# Patient Record
Sex: Female | Born: 1990 | Race: White | Hispanic: No | Marital: Married | Smoking: Former smoker
Health system: Southern US, Academic
[De-identification: ages and names within clinical notes are randomized; demographics above are authoritative.]

## PROBLEM LIST (undated history)

## (undated) DIAGNOSIS — Z789 Other specified health status: Secondary | ICD-10-CM

## (undated) DIAGNOSIS — N809 Endometriosis, unspecified: Secondary | ICD-10-CM

## (undated) HISTORY — PX: HX PELVIC LAPAROSCOPY: SHX162

## (undated) HISTORY — DX: Endometriosis, unspecified: N80.9

---
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
## 2015-08-10 DIAGNOSIS — S022XXA Fracture of nasal bones, initial encounter for closed fracture: Secondary | ICD-10-CM

## 2015-08-10 DIAGNOSIS — S31114A Laceration without foreign body of abdominal wall, left lower quadrant without penetration into peritoneal cavity, initial encounter: Secondary | ICD-10-CM

## 2015-08-10 DIAGNOSIS — S0181XA Laceration without foreign body of other part of head, initial encounter: Secondary | ICD-10-CM

## 2015-08-11 DIAGNOSIS — F0781 Postconcussional syndrome: Secondary | ICD-10-CM

## 2019-06-11 ENCOUNTER — Emergency Department (HOSPITAL_COMMUNITY): Payer: Medicaid Other

## 2019-06-11 ENCOUNTER — Encounter (HOSPITAL_COMMUNITY): Payer: Self-pay | Admitting: Emergency Medicine

## 2019-06-11 ENCOUNTER — Emergency Department (HOSPITAL_COMMUNITY): Payer: Medicaid Other | Admitting: Certified Registered"

## 2019-06-11 ENCOUNTER — Encounter (HOSPITAL_COMMUNITY)
Admission: EM | Disposition: A | Discharge status: Home / Self Care | Payer: Self-pay | Source: Home / Self Care | Attending: Emergency Medicine

## 2019-06-11 ENCOUNTER — Other Ambulatory Visit: Payer: Self-pay

## 2019-06-11 ENCOUNTER — Ambulatory Visit (HOSPITAL_COMMUNITY)
Admission: EM | Admit: 2019-06-11 | Discharge: 2019-06-11 | Disposition: A | Discharge status: Home / Self Care | Payer: Medicaid Other | Attending: Emergency Medicine | Admitting: Emergency Medicine

## 2019-06-11 DIAGNOSIS — Z3A23 23 weeks gestation of pregnancy: Secondary | ICD-10-CM | POA: Insufficient documentation

## 2019-06-11 DIAGNOSIS — Z20822 Contact with and (suspected) exposure to covid-19: Secondary | ICD-10-CM | POA: Insufficient documentation

## 2019-06-11 DIAGNOSIS — Z881 Allergy status to other antibiotic agents status: Secondary | ICD-10-CM | POA: Insufficient documentation

## 2019-06-11 DIAGNOSIS — S42292A Other displaced fracture of upper end of left humerus, initial encounter for closed fracture: Secondary | ICD-10-CM | POA: Diagnosis not present

## 2019-06-11 DIAGNOSIS — Y92410 Unspecified street and highway as the place of occurrence of the external cause: Secondary | ICD-10-CM | POA: Diagnosis not present

## 2019-06-11 DIAGNOSIS — O9A212 Injury, poisoning and certain other consequences of external causes complicating pregnancy, second trimester: Secondary | ICD-10-CM | POA: Insufficient documentation

## 2019-06-11 DIAGNOSIS — S4292XA Fracture of left shoulder girdle, part unspecified, initial encounter for closed fracture: Secondary | ICD-10-CM

## 2019-06-11 DIAGNOSIS — T148XXA Other injury of unspecified body region, initial encounter: Secondary | ICD-10-CM

## 2019-06-11 DIAGNOSIS — S43006A Unspecified dislocation of unspecified shoulder joint, initial encounter: Secondary | ICD-10-CM

## 2019-06-11 DIAGNOSIS — S43005A Unspecified dislocation of left shoulder joint, initial encounter: Secondary | ICD-10-CM

## 2019-06-11 HISTORY — PX: SHOULDER CLOSED REDUCTION: SHX1051

## 2019-06-11 LAB — COMPREHENSIVE METABOLIC PANEL
ALT: 22 U/L (ref 0–44)
AST: 28 U/L (ref 15–41)
Albumin: 3 g/dL — ABNORMAL LOW (ref 3.5–5.0)
Alkaline Phosphatase: 64 U/L (ref 38–126)
Anion gap: 9 (ref 5–15)
BUN: 7 mg/dL (ref 6–20)
CO2: 20 mmol/L — ABNORMAL LOW (ref 22–32)
Calcium: 8.7 mg/dL — ABNORMAL LOW (ref 8.9–10.3)
Chloride: 108 mmol/L (ref 98–111)
Creatinine, Ser: 0.48 mg/dL (ref 0.44–1.00)
GFR calc Af Amer: >60 mL/min (ref >60)
GFR calc non Af Amer: >60 mL/min (ref >60)
Glucose, Bld: 120 mg/dL — ABNORMAL HIGH (ref 70–99)
Potassium: 4.1 mmol/L (ref 3.5–5.1)
Sodium: 137 mmol/L (ref 135–145)
Total Bilirubin: 0.3 mg/dL (ref 0.3–1.2)
Total Protein: 6.3 g/dL — ABNORMAL LOW (ref 6.5–8.1)

## 2019-06-11 LAB — I-STAT CHEM 8, ED
BUN: 6 mg/dL (ref 6–20)
Calcium, Ion: 1.11 mmol/L — ABNORMAL LOW (ref 1.15–1.40)
Chloride: 109 mmol/L (ref 98–111)
Creatinine, Ser: 0.3 mg/dL — ABNORMAL LOW (ref 0.44–1.00)
Glucose, Bld: 115 mg/dL — ABNORMAL HIGH (ref 70–99)
HCT: 33 % — ABNORMAL LOW (ref 36.0–46.0)
Hemoglobin: 11.2 g/dL — ABNORMAL LOW (ref 12.0–15.0)
Potassium: 3.8 mmol/L (ref 3.5–5.1)
Sodium: 137 mmol/L (ref 135–145)
TCO2: 22 mmol/L (ref 22–32)

## 2019-06-11 LAB — RESPIRATORY PANEL BY RT PCR (FLU A&B, COVID)
Influenza A by PCR: NEGATIVE
Influenza B by PCR: NEGATIVE
SARS Coronavirus 2 by RT PCR: NEGATIVE

## 2019-06-11 LAB — TYPE AND SCREEN
ABO/RH(D): A POS
Antibody Screen: NEGATIVE

## 2019-06-11 LAB — CBC
HCT: 35.6 % — ABNORMAL LOW (ref 36.0–46.0)
Hemoglobin: 12 g/dL (ref 12.0–15.0)
MCH: 32.3 pg (ref 26.0–34.0)
MCHC: 33.7 g/dL (ref 30.0–36.0)
MCV: 96 fL (ref 80.0–100.0)
Platelets: 307 10*3/uL (ref 150–400)
RBC: 3.71 MIL/uL — ABNORMAL LOW (ref 3.87–5.11)
RDW: 13.6 % (ref 11.5–15.5)
WBC: 21.9 10*3/uL — ABNORMAL HIGH (ref 4.0–10.5)
nRBC: 0 % (ref 0.0–0.2)

## 2019-06-11 LAB — ABO/RH: ABO/RH(D): A POS

## 2019-06-11 SURGERY — CLOSED REDUCTION, SHOULDER
Anesthesia: General | Site: Shoulder | Laterality: Left

## 2019-06-11 MED ORDER — PROPOFOL 500 MG/50ML IV EMUL
INTRAVENOUS | Status: DC | PRN
  Administered 2019-06-11 (×2): 200 mg via INTRAVENOUS

## 2019-06-11 MED ORDER — OXYCODONE HCL 5 MG PO TABS: 5.0000 mg | ORAL_TABLET | Freq: Once | ORAL | Status: DC | PRN

## 2019-06-11 MED ORDER — PROPOFOL 10 MG/ML IV BOLUS
INTRAVENOUS | Status: AC
  Filled 2019-06-11: qty 20

## 2019-06-11 MED ORDER — OXYCODONE HCL 5 MG/5ML PO SOLN: 5.0000 mg | Freq: Once | ORAL | Status: DC | PRN

## 2019-06-11 MED ORDER — HYDROCODONE-ACETAMINOPHEN 5-325 MG PO TABS: 1.0000 | ORAL_TABLET | Freq: Four times a day (QID) | ORAL | 0 refills | Status: AC | PRN

## 2019-06-11 MED ORDER — IOHEXOL 300 MG/ML  SOLN
75.0000 mL | Freq: Once | INTRAMUSCULAR | Status: AC | PRN
  Administered 2019-06-11: 75 mL via INTRAVENOUS

## 2019-06-11 MED ORDER — FENTANYL CITRATE (PF) 100 MCG/2ML IJ SOLN
50.0000 ug | Freq: Once | INTRAMUSCULAR | Status: AC
  Administered 2019-06-11: 50 ug via INTRAVENOUS
  Filled 2019-06-11: qty 2

## 2019-06-11 MED ORDER — LIDOCAINE 2% (20 MG/ML) 5 ML SYRINGE
INTRAMUSCULAR | Status: AC
  Filled 2019-06-11: qty 5

## 2019-06-11 MED ORDER — FENTANYL CITRATE (PF) 100 MCG/2ML IJ SOLN: 25.0000 ug | INTRAMUSCULAR | Status: DC | PRN

## 2019-06-11 MED ORDER — ONDANSETRON HCL 4 MG/2ML IJ SOLN: 4.0000 mg | Freq: Four times a day (QID) | INTRAMUSCULAR | Status: DC | PRN

## 2019-06-11 MED ORDER — FENTANYL CITRATE (PF) 100 MCG/2ML IJ SOLN: 25.0000 ug | Freq: Once | INTRAMUSCULAR | Status: DC

## 2019-06-11 MED ORDER — FENTANYL CITRATE (PF) 250 MCG/5ML IJ SOLN
INTRAMUSCULAR | Status: AC
  Filled 2019-06-11: qty 5

## 2019-06-11 MED ORDER — ROCURONIUM BROMIDE 10 MG/ML (PF) SYRINGE
PREFILLED_SYRINGE | INTRAVENOUS | Status: AC
  Filled 2019-06-11: qty 10

## 2019-06-11 NOTE — Transfer of Care (Signed)
Immediate Anesthesia Transfer of Care Note  Patient: Pam Cobb  Procedure(s) Performed: CLOSED REDUCTION SHOULDER (Left Shoulder)  Patient Location: PACU  Anesthesia Type:General  Level of Consciousness: awake, alert , oriented and patient cooperative  Airway & Oxygen Therapy: Patient Spontanous Breathing  Post-op Assessment: Report given to RN, Post -op Vital signs reviewed and stable and Patient moving all extremities X 4  Post vital signs: Reviewed and stable  Last Vitals:  Vitals Value Taken Time  BP 112/73 06/11/19 1906  Temp 36.6 C 06/11/19 1906  Pulse 98 06/11/19 1911  Resp 19 06/11/19 1911  SpO2 99 % 06/11/19 1911  Vitals shown include unvalidated device data.  Last Pain:  Vitals:   06/11/19 1906  TempSrc:   PainSc: 0-No pain         Complications: No apparent anesthesia complications

## 2019-06-11 NOTE — ED Notes (Signed)
RN educated patient about the importance of wearing a cervical collar due to pt endorsing L neck pain. Pt attempted to wear collar but told staff to take it off x3. RN gave pain medication per Dr. Denny Levy order, and re-attempted to place C-collar and pt refused.

## 2019-06-11 NOTE — Anesthesia Preprocedure Evaluation (Signed)
Anesthesia Evaluation  Patient identified by MRN, date of birth, ID band Patient awake    Reviewed: Allergy & Precautions, H&P , NPO status , Patient's Chart, lab work & pertinent test results  Airway Mallampati: II   Neck ROM: full    Dental   Pulmonary neg pulmonary ROS,    breath sounds clear to auscultation       Cardiovascular negative cardio ROS   Rhythm:regular Rate:Normal     Neuro/Psych    GI/Hepatic   Endo/Other    Renal/GU      Musculoskeletal Left shoulder dislocation.   Abdominal   Peds  Hematology   Anesthesia Other Findings   Reproductive/Obstetrics (+) Pregnancy                             Anesthesia Physical Anesthesia Plan  ASA: II  Anesthesia Plan: General   Post-op Pain Management:    Induction: Intravenous  PONV Risk Score and Plan: 3 and Propofol infusion, Ondansetron and Treatment may vary due to age or medical condition  Airway Management Planned: Mask  Additional Equipment:   Intra-op Plan:   Post-operative Plan:   Informed Consent: I have reviewed the patients History and Physical, chart, labs and discussed the procedure including the risks, benefits and alternatives for the proposed anesthesia with the patient or authorized representative who has indicated his/her understanding and acceptance.       Plan Discussed with: CRNA, Anesthesiologist and Surgeon  Anesthesia Plan Comments:         Anesthesia Quick Evaluation
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
## 2019-06-11 NOTE — ED Notes (Addendum)
Pt informed that a cervical collar needed to be applied. Staff attempted to place the collar. Pt stated that she was experiencing too much pain to wear it. Pt informed that it was important for her to wear it and that it was for her protection. Pt asked staff to remove the collar X 3.
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
## 2019-06-11 NOTE — ED Notes (Signed)
Pt given cervical collar but refused to have it applied due to pain at the left shoulder. Mykenzie Cochrane, RN gave pt pain medicine to help. Cervical collar was applied, however pt requested to take it off immediately because pain still could not be tolerated. [facility] is at bedside.

## 2019-06-11 NOTE — ED Notes (Signed)
Dr haddox at  The bedside  Or ready

## 2019-06-11 NOTE — ED Notes (Signed)
Unsuccessful attempt to relocate shoulder
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
## 2019-06-11 NOTE — ED Notes (Signed)
To ct

## 2019-06-11 NOTE — ED Notes (Signed)
Rapid OB at bedside 
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
## 2019-06-11 NOTE — Discharge Instructions (Signed)
[facility] Discharge Instructions   General Discharge Instructions  WEIGHT BEARING STATUS: Non-weightbearing left upper extremity  RANGE OF MOTION/ACTIVITY: Remain in sling at all times  DVT/PE prophylaxis: None  Diet: as you were eating previously.  Can use over the counter stool softeners and bowel preparations, such as Miralax, to help with bowel movements.  Narcotics can be constipating.  Be sure to drink plenty of fluids  PAIN MEDICATION USE AND EXPECTATIONS  You have likely been given narcotic medications to help control your pain.  After a traumatic event that results in an fracture (broken bone) with or without surgery, it is ok to use narcotic pain medications to help control one's pain.  We understand that everyone responds to pain differently and each individual patient will be evaluated on a regular basis for the continued need for narcotic medications. Ideally, narcotic medication use should last no more than 6-8 weeks (coinciding with fracture healing).   As a patient it is your responsibility as well to monitor narcotic medication use and report the amount and frequency you use these medications when you come to your office visit.   We would also advise that if you are using narcotic medications, you should take a dose prior to therapy to maximize you participation.  IF YOU ARE ON NARCOTIC MEDICATIONS IT IS NOT PERMISSIBLE TO OPERATE A MOTOR VEHICLE (MOTORCYCLE/CAR/TRUCK/MOPED) OR HEAVY MACHINERY DO NOT MIX NARCOTICS WITH OTHER CNS (CENTRAL NERVOUS SYSTEM) DEPRESSANTS SUCH AS ALCOHOL   STOP SMOKING OR USING NICOTINE PRODUCTS!!!!  As discussed nicotine severely impairs your body's ability to heal surgical and traumatic wounds but also impairs bone healing.  Wounds and bone heal by forming microscopic blood vessels (angiogenesis) and nicotine is a vasoconstrictor (essentially, shrinks blood vessels).  Therefore, if vasoconstriction occurs to these microscopic  blood vessels they essentially disappear and are unable to deliver necessary nutrients to the healing tissue.  This is one modifiable factor that you can do to dramatically increase your chances of healing your injury.    (This means no smoking, no nicotine gum, patches, etc)  DO NOT USE NONSTEROIDAL ANTI-INFLAMMATORY DRUGS (NSAID'S)  Using products such as Advil (ibuprofen), Aleve (naproxen), Motrin (ibuprofen) for additional pain control during fracture healing can delay and/or prevent the healing response.  If you would like to take over the counter (OTC) medication, Tylenol (acetaminophen) is ok.  However, some narcotic medications that are given for pain control contain acetaminophen as well. Therefore, you should not exceed more than 4000 mg of tylenol in a day if you do not have liver disease.  Also note that there are may OTC medicines, such as cold medicines and allergy medicines that my contain tylenol as well.  If you have any questions about medications and/or interactions please ask your doctor/PA or your pharmacist.      ICE AND ELEVATE INJURED/OPERATIVE EXTREMITY  Using ice and elevating the injured extremity above your heart can help with swelling and pain control.  Icing in a pulsatile fashion, such as 20 minutes on and 20 minutes off, can be followed.    Do not place ice directly on skin. Make sure there is a barrier between to skin and the ice pack.    Using frozen items such as frozen peas works well as the conform nicely to the are that needs to be iced.  USE AN ACE WRAP OR TED HOSE FOR SWELLING CONTROL  In addition to icing and elevation, Ace wraps or TED hose are used  to help limit and resolve swelling.  It is recommended to use Ace wraps or TED hose until you are informed to stop.    When using Ace Wraps start the wrapping distally (farthest away from the body) and wrap proximally (closer to the body)   Example: If you had surgery on your leg or thing and you do not have a  splint on, start the ace wrap at the toes and work your way up to the thigh        If you had surgery on your upper extremity and do not have a splint on, start the ace wrap at your fingers and work your way up to the upper arm   CALL THE OFFICE WITH ANY QUESTIONS OR CONCERNS: [phone]   VISIT OUR WEBSITE FOR ADDITIONAL INFORMATION: [email or web address]

## 2019-06-11 NOTE — Progress Notes (Signed)
OB RR RN at bedside for post procedural assessment of G3P2 at [redacted]w[redacted]d. FHR 136 by doppler, pt reports good fetal movement, no bleeding, cramping, or leaking of fluid.  Instructed to go to MAU if any problems develop after she leaves PACU.

## 2019-06-11 NOTE — ED Notes (Signed)
To or 

## 2019-06-11 NOTE — Progress Notes (Signed)
Orthopedic Tech Progress Note Patient Details:  Pam Cobb 08-24-1990 [id number]  Ortho Devices Type of Ortho Device: Shoulder immobilizer Ortho Device/Splint Location: left Ortho Device/Splint Interventions: Application   Post Interventions Patient Tolerated: Well Instructions Provided: Care of device   [name] 06/11/2019, 3:30 PM
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
## 2019-06-11 NOTE — Progress Notes (Signed)
Discharge AVS reviewed with patients husband Gerilyn Pilgrim via ED. All questions answered and verbalized understanding.  1932-OB RN at bedside to assess fetal heart tones.

## 2019-06-11 NOTE — ED Notes (Signed)
Rapid OB called per Dr. Rosalia Hammers for fetal monitoring.

## 2019-06-11 NOTE — ED Notes (Signed)
Pt returned from c-t  covid swab walked to lab

## 2019-06-11 NOTE — ED Notes (Signed)
Crying constantly

## 2019-06-13 NOTE — Anesthesia Postprocedure Evaluation (Signed)
Anesthesia Post Note  Patient: Pam Cobb  Procedure(s) Performed: CLOSED REDUCTION SHOULDER (Left Shoulder)     Patient location during evaluation: PACU Anesthesia Type: General Level of consciousness: awake and alert Pain management: pain level controlled Vital Signs Assessment: post-procedure vital signs reviewed and stable Respiratory status: spontaneous breathing, nonlabored ventilation, respiratory function stable and patient connected to nasal cannula oxygen Cardiovascular status: blood pressure returned to baseline and stable Postop Assessment: no apparent nausea or vomiting Anesthetic complications: no    Last Vitals:  Vitals:   06/11/19 1918 06/11/19 1933  BP: 120/78 113/63  Pulse: 91 80  Resp: 19 18  Temp: 36.6 C 36.6 C  SpO2: 100% 100%    Last Pain:  Vitals:   06/11/19 1933  TempSrc:   PainSc: 0-No pain                 Deniah Saia S

## 2020-04-19 IMAGING — CT CT CHEST W/ CM
3 series · 14 of 35 positions shown, 17 images · IV contrast (Omni 300)
Comparison: None.

CLINICAL DATA: Neck trauma. Pain in left shoulder and neck that
radiates down into hand.

EXAM:
CT CERVICAL SPINE WITHOUT CONTRAST
CT CHEST WITH CONTRAST
TECHNIQUE: Multidetector CT imaging of the cervical spine was performed without
intravenous contrast. Multiplanar CT image reconstructions were also
generated.
Multidetector CT imaging of the chest was performed following the
standard protocol during bolus administration of intravenous
contrast.
CONTRAST:  75mL OMNIPAQUE IOHEXOL 300 MG/ML  SOLN

[Series 1: chest with 2mm st · axial · 0.89mm/px · z∈[-413,-173]mm · 6 of 157 slices shown, 8 images]
[im 25/157  soft-tissue]
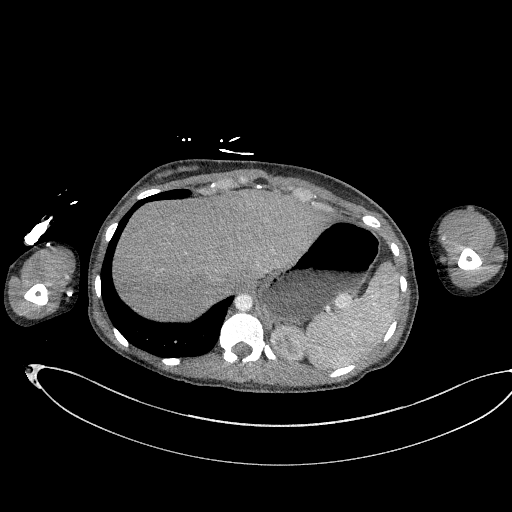
[im 25/157  bone]
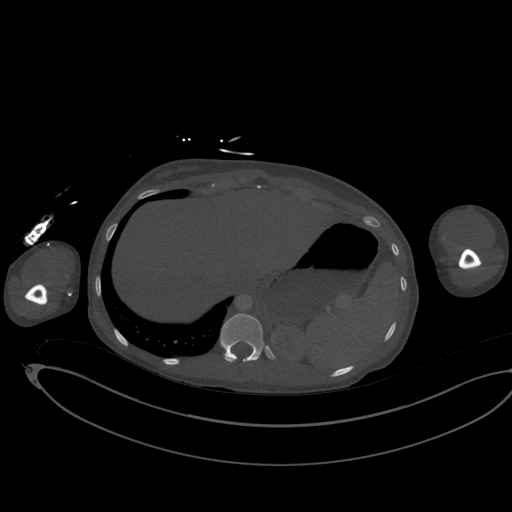
[im 49/157  bone]
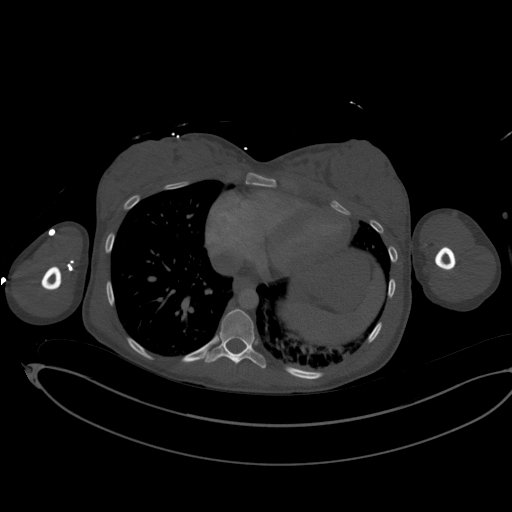
[im 73/157  bone]
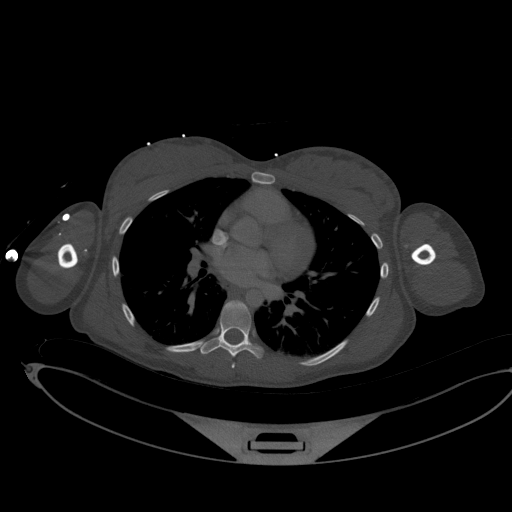
[im 97/157  bone]
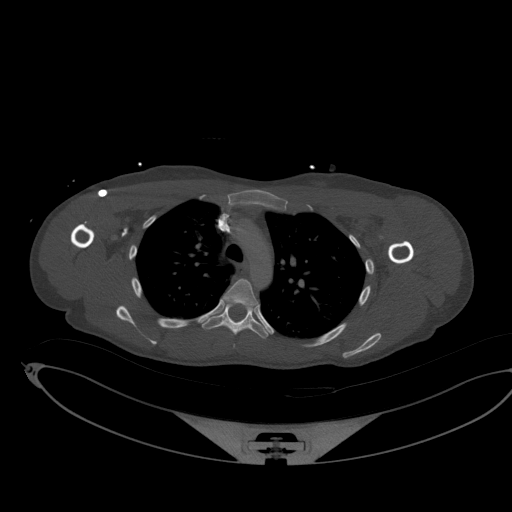
[im 121/157  soft-tissue]
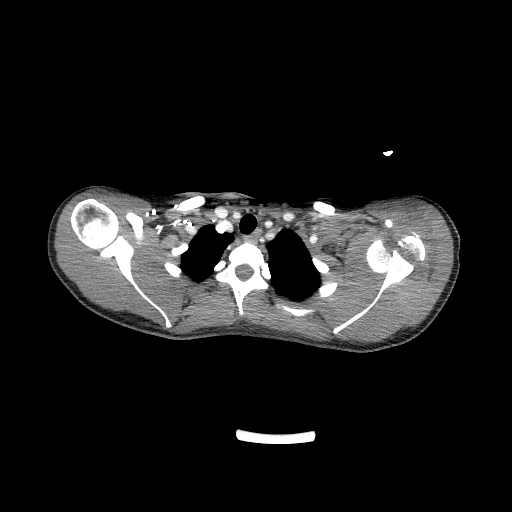
[im 121/157  bone]
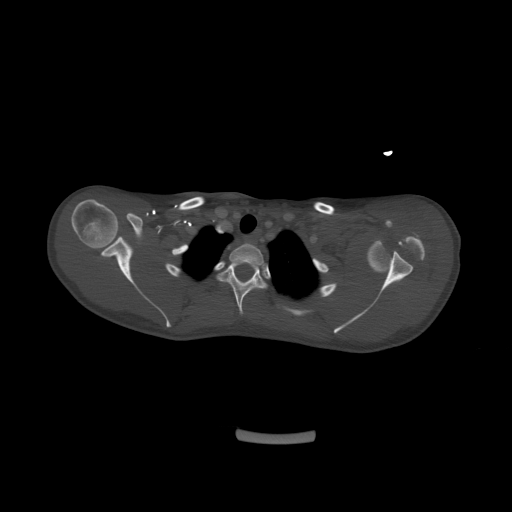
[im 145/157  bone]
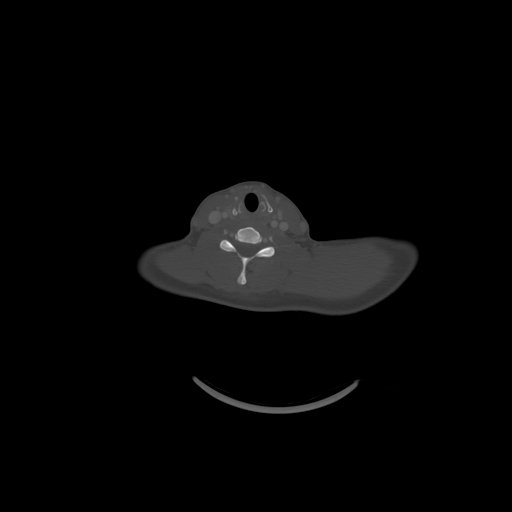

[Series 5: chest with 2mm st cor · coronal · 0.62mm/px · 3 of 147 slices shown]
[im 30/147  bone]
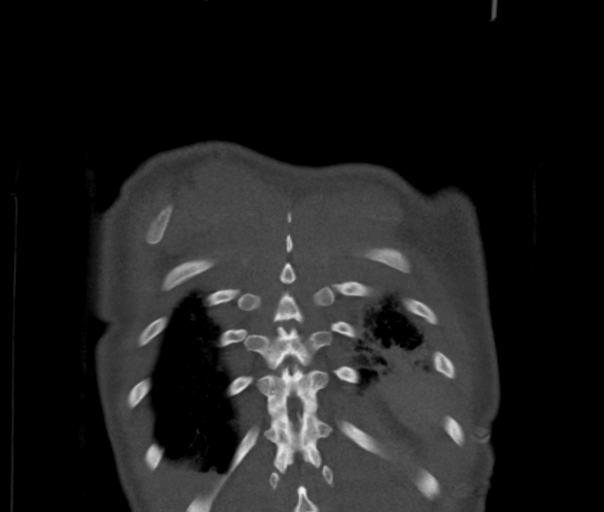
[im 59/147  bone]
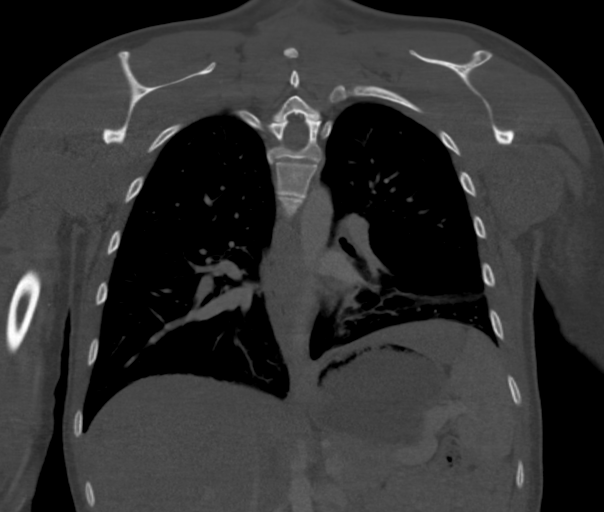
[im 88/147  bone]
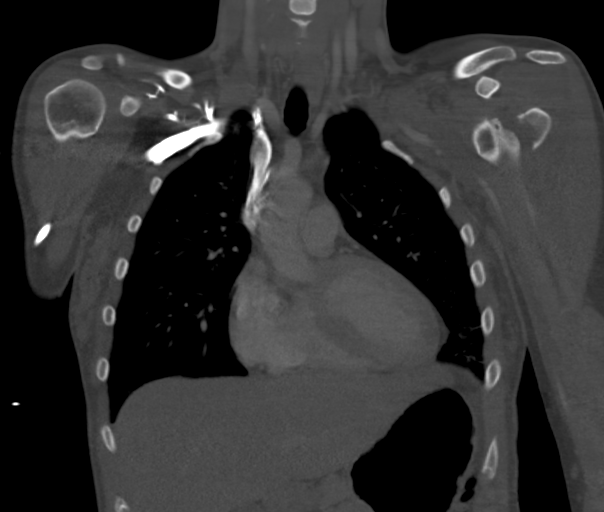

[Series 6: chest with 2mm st sag · sagittal · 0.53mm/px · 5 of 172 slices shown, 6 images]
[im 58/172  bone]
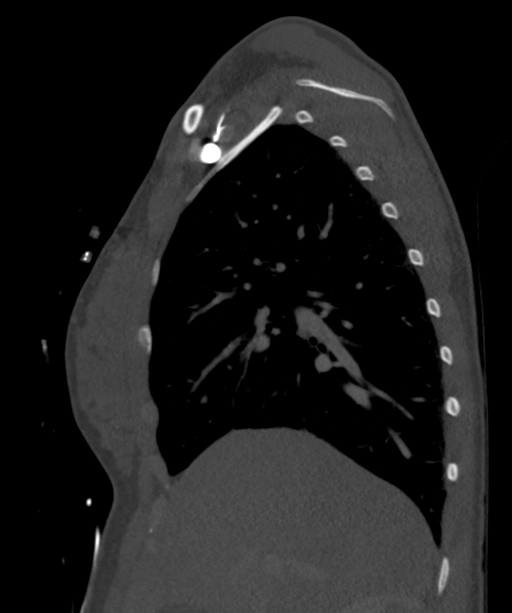
[im 72/172  bone]
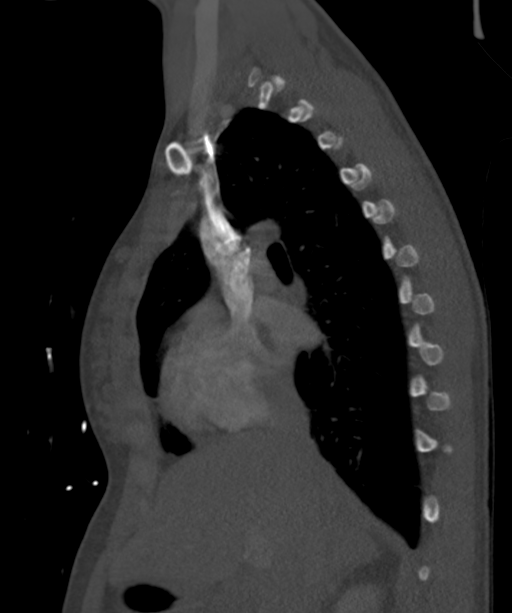
[im 86/172  soft-tissue]
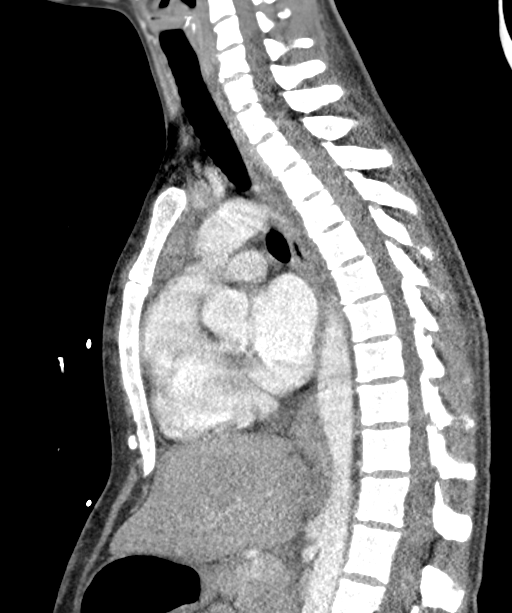
[im 86/172  bone]
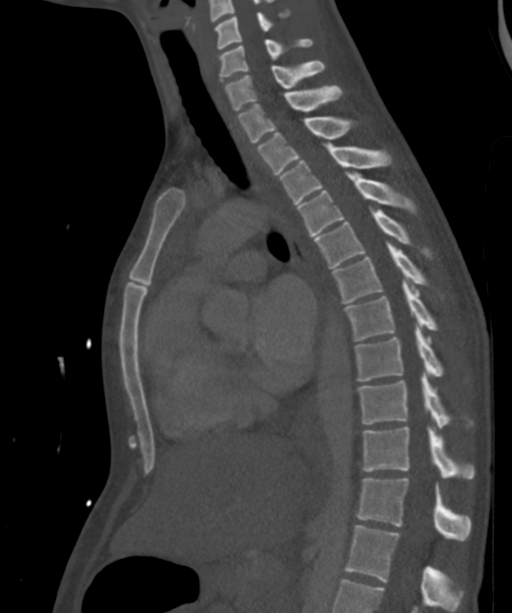
[im 100/172  bone]
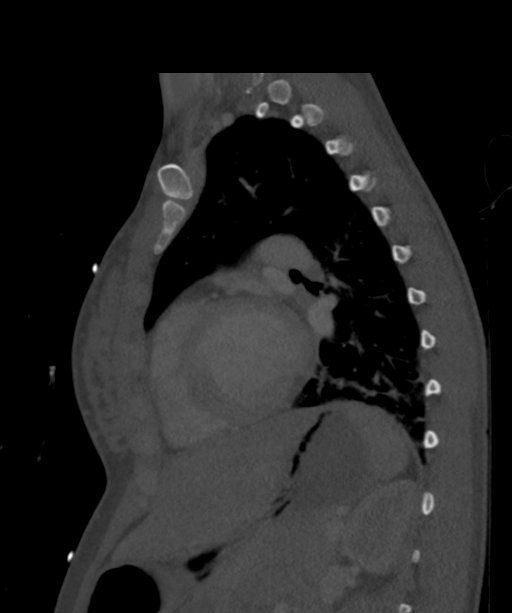
[im 115/172  bone]
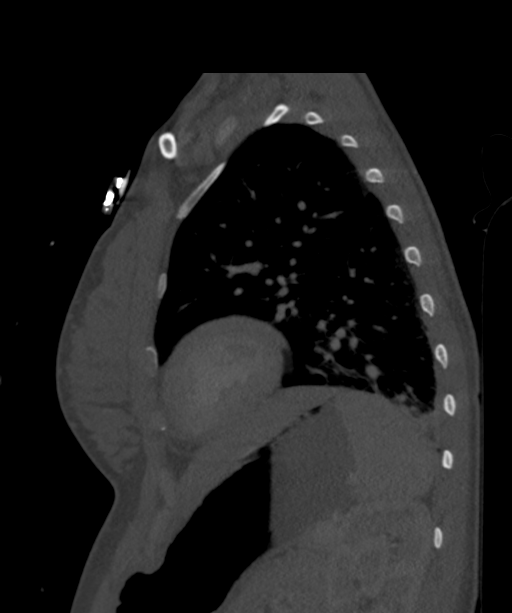

[14 of 35 positions shown; findings below may reference images not displayed]

FINDINGS: CT CERVICAL FINDINGS

Alignment: Normal.

Skull base and vertebrae: No acute fracture. No primary bone lesion
or focal pathologic process.

Soft tissues and spinal canal: No prevertebral fluid or swelling. No
visible canal hematoma.

Disc levels:  Normal.

Other: There is extensive fat stranding in the left supraclavicular
region at about the left shoulder.

CT CHEST FINDINGS

Cardiovascular: The heart size is normal. There is no significant
pericardial effusion. No evidence for thoracic aortic aneurysm or
dissection. There is no large centrally located pulmonary embolism.

Mediastinum/Nodes:

--No mediastinal or hilar lymphadenopathy.

--No axillary lymphadenopathy.

--No supraclavicular lymphadenopathy.

--Normal thyroid gland.

--The esophagus is unremarkable

Lungs/Pleura: There is no pneumothorax. There is some atelectasis at
the left lung base. There is no significant pleural effusion. There
is some debris within the trachea.

Upper Abdomen: The stomach is distended.

Musculoskeletal: Again noted is an acute fracture dislocation of the
left shoulder. There is a Neer 2 part fracture involving the greater
tuberosity. The humeral head is dislocated anteriorly and inferiorly
and is perched on the inferior glenoid. There is no evidence for a
bony Bankart lesion. There is a large joint effusion with evidence
for lipohemarthrosis. The visualized portions of the left
subclavian, axillary, and brachial arteries are patent on the
patient's left.
IMPRESSION: 1. No acute cervical spine fracture.
2. Acute fracture dislocation of the left shoulder as detailed
above. There is a large left-sided glenohumeral joint effusion with
evidence for lipohemarthrosis.
3. Small amount of atelectasis at the left lung base without
evidence for a pneumothorax or significant pleural effusion. There
is no evidence for rib fracture.

## 2021-06-02 ENCOUNTER — Encounter (HOSPITAL_COMMUNITY): Payer: Self-pay | Admitting: Physician Assistant

## 2021-06-02 ENCOUNTER — Emergency Department
Admission: EM | Admit: 2021-06-02 | Discharge: 2021-06-02 | Disposition: A | Discharge status: Home / Self Care | Payer: Medicaid Other | Attending: Physician Assistant | Admitting: Physician Assistant

## 2021-06-02 ENCOUNTER — Other Ambulatory Visit: Payer: Self-pay

## 2021-06-02 DIAGNOSIS — Z3201 Encounter for pregnancy test, result positive: Secondary | ICD-10-CM | POA: Insufficient documentation

## 2021-06-02 DIAGNOSIS — Z3490 Encounter for supervision of normal pregnancy, unspecified, unspecified trimester: Secondary | ICD-10-CM

## 2021-06-02 LAB — HCG, URINE QUALITATIVE, PREGNANCY: HCG URINE QUALITATIVE: POSITIVE

## 2021-06-02 NOTE — ED Provider Notes (Signed)
[facility]  ED Primary Provider Note  History of Present Illness   Chief Complaint   Patient presents with   . Pregnancy Problem     REQUESTS PREGNANCY TEST     Patricia Santiago is a 31 y.o. female who had concerns including Pregnancy Problem.  Arrival: The patient arrived by Car    Patient presents with complaints of amenorrhea with a last menstrual period began 05/03/2021.  She needs a positive pregnancy test as proved to obtain Medicaid.  She reports some lower abdominal cramping which she describes as implantation pain "this is my 4th pregnancy" but denies vaginal bleeding or dysuria.  Had a positive home pregnancy test.        Review of Systems   Pertinent positive and negative ROS as per HPI.  Historical Data   History Reviewed This Encounter: Medical History  Surgical History  Family History  Social History      Physical Exam   ED Triage Vitals [06/02/21 1428]   BP (Non-Invasive) 127/87   Heart Rate 90   Respiratory Rate 16   Temperature 36.9 C (98.4 F)   SpO2 100 %   Weight 47.6 kg (105 lb)   Height      Physical Exam   General: WDWN, appears stated age, alert in NAD. Cooperative throughout the exam.  Eyes:  no orbital edema or erythema, pupils equal, EOMI, no scleral icterus or conjunctival injection,  CV: good peripheral perfusion by inspection  Resp: no respiratory distress  MSK: moves all extremities  Skin: normal color, no lesions noted on the visible skin  Neuro: normal tone, no involuntary movements, GCS 4,5,6,   Psych: speech and affect are appropriate, thought processes intact     Patient Data     Labs Ordered/Reviewed   HCG, URINE QUALITATIVE, PREGNANCY     No orders to display     Medical Decision Making        Medical Decision Making  Amenorrhea female presents requesting pregnancy test.  Differential diagnosis includes pregnancy, amenorrhea due to other etiologies.  Review of history, physical exam and urine pregnancy test revealed positive pregnancy test.   Patient advised starting prenatal vitamins and scheduled point with OBGYN.    Amount and/or Complexity of Data Reviewed  Labs: ordered. Decision-making details documented in ED Course.  ECG/medicine tests: independent interpretation performed.        ED Course as of 06/02/21 1654   Mon Jun 02, 2021   1640 PREGNANCY, URINE: Positive   1642 PREGNANCY, URINE: Positive            Clinical Impression   Pregnancy, unspecified gestational age (Primary)       Disposition: Discharged

## 2021-06-02 NOTE — Discharge Instructions (Addendum)
Schedule appointment with your OBGYN or one of those listed above.  Return to the ER in the interval.  Start taking a daily prenatal vitamins.

## 2021-06-07 ENCOUNTER — Other Ambulatory Visit: Payer: Self-pay

## 2021-06-07 ENCOUNTER — Emergency Department
Admission: EM | Admit: 2021-06-07 | Discharge: 2021-06-07 | Disposition: A | Discharge status: Home / Self Care | Payer: Medicaid Other | Attending: Family | Admitting: Family

## 2021-06-07 ENCOUNTER — Encounter (HOSPITAL_BASED_OUTPATIENT_CLINIC_OR_DEPARTMENT_OTHER): Payer: Self-pay

## 2021-06-07 DIAGNOSIS — M549 Dorsalgia, unspecified: Secondary | ICD-10-CM

## 2021-06-07 DIAGNOSIS — O99891 Other specified diseases and conditions complicating pregnancy: Secondary | ICD-10-CM | POA: Insufficient documentation

## 2021-06-07 DIAGNOSIS — Z3490 Encounter for supervision of normal pregnancy, unspecified, unspecified trimester: Secondary | ICD-10-CM

## 2021-06-07 DIAGNOSIS — M545 Low back pain, unspecified: Secondary | ICD-10-CM | POA: Insufficient documentation

## 2021-06-07 DIAGNOSIS — Z3A01 Less than 8 weeks gestation of pregnancy: Secondary | ICD-10-CM | POA: Insufficient documentation

## 2021-06-07 DIAGNOSIS — Z681 Body mass index (BMI) 19 or less, adult: Secondary | ICD-10-CM

## 2021-06-07 HISTORY — DX: Other specified health status: Z78.9

## 2021-06-07 LAB — URINALYSIS, MACRO/MICRO
BILIRUBIN: NEGATIVE mg/dL
BLOOD: NEGATIVE mg/dL
GLUCOSE: NEGATIVE mg/dL
KETONES: NEGATIVE mg/dL
LEUKOCYTES: NEGATIVE WBCs/uL
NITRITE: NEGATIVE
PH: 6 (ref 4.6–8.0)
PROTEIN: NEGATIVE mg/dL
SPECIFIC GRAVITY: <=1.005 (ref 1.003–1.035)
UROBILINOGEN: 0.2 mg/dL (ref 0.2–1.0)

## 2021-06-07 LAB — COMPREHENSIVE METABOLIC PANEL, NON-FASTING
ALBUMIN/GLOBULIN RATIO: 1.2 (ref 0.8–1.4)
ALBUMIN: 4.2 g/dL (ref 3.4–5.0)
ALKALINE PHOSPHATASE: 34 U/L — ABNORMAL LOW (ref 46–116)
ALT (SGPT): 69 U/L (ref <=78)
ANION GAP: 11 mmol/L (ref 10–20)
AST (SGOT): 27 U/L (ref 15–37)
BILIRUBIN TOTAL: 0.5 mg/dL (ref 0.2–1.0)
BUN/CREA RATIO: 9
BUN: 7 mg/dL (ref 7–18)
CALCIUM, CORRECTED: 8.6 mg/dL
CALCIUM: 8.8 mg/dL (ref 8.5–10.1)
CHLORIDE: 103 mmol/L (ref 98–107)
CO2 TOTAL: 25 mmol/L (ref 21–32)
CREATININE: 0.8 mg/dL (ref 0.55–1.02)
ESTIMATED GFR: 101 mL/min/{1.73_m2} (ref >59)
GLOBULIN: 3.6
GLUCOSE: 89 mg/dL (ref 74–106)
OSMOLALITY, CALCULATED: 275 mOsm/kg (ref 270–290)
POTASSIUM: 3.7 mmol/L (ref 3.5–5.1)
PROTEIN TOTAL: 7.8 g/dL (ref 6.4–8.2)
SODIUM: 139 mmol/L (ref 136–145)

## 2021-06-07 LAB — CBC WITH DIFF
BASOPHIL #: 0.02 10*3/uL (ref 0.00–2.50)
BASOPHIL %: 0 % (ref 0–3)
EOSINOPHIL #: 0.15 10*3/uL (ref 0.00–2.40)
EOSINOPHIL %: 2 % (ref 0–7)
HCT: 37.1 % (ref 37.0–47.0)
HGB: 13.2 g/dL (ref 12.5–16.0)
LYMPHOCYTE #: 1.58 10*3/uL — ABNORMAL LOW (ref 2.10–11.00)
LYMPHOCYTE %: 23 % — ABNORMAL LOW (ref 25–45)
MCH: 31.1 pg (ref 27.0–32.0)
MCHC: 35.5 g/dL (ref 32.0–36.0)
MCV: 87.6 fL (ref 78.0–99.0)
MONOCYTE #: 0.75 10*3/uL (ref 0.00–4.10)
MONOCYTE %: 11 % (ref 0–12)
MPV: 8.4 fL (ref 7.4–10.4)
NEUTROPHIL #: 4.36 10*3/uL (ref 4.10–29.00)
NEUTROPHIL %: 64 % (ref 40–76)
PLATELETS: 293 10*3/uL (ref 140–440)
RBC: 4.24 10*6/uL (ref 4.20–5.40)
RDW: 15.7 % — ABNORMAL HIGH (ref 11.6–14.8)
WBC: 6.9 10*3/uL (ref 4.0–10.5)

## 2021-06-07 LAB — HCG, PLASMA OR SERUM QUANTITATIVE, PREGNANCY: HCG QUANTITATIVE PREGNANCY: 15330 IU/L — ABNORMAL HIGH (ref <=5)

## 2021-06-07 LAB — ABO & RH: ABO/RH(D): A POS

## 2021-06-07 NOTE — ED Nurses Note (Signed)
Light pinkish drainage x 2 last two days. States, "I noticed it when I wiped. Yesterday and again today. My lower back hurts some and I have a little cramping in my lower abdomen. " LMP 05/03/2021 confirmed Pregnancy. Denies any urinary c/o's . Abdomen soft non tender.

## 2021-06-07 NOTE — ED Provider Notes (Signed)
[facility] Emergency Department  ED Primary Provider Note  History of Present Illness   Chief Complaint   Patient presents with    Vaginal Bleeding     Arrival: The patient arrived by Car    Patricia Santiago is a 31 y.o. female who had concerns including Vaginal Bleeding.  Pt states she is approx 5 weeks preg.  Had  Scant pink DC, has pic on phone once.  G 4 p3 a 0 had some lower back pain. No bleeding now. dont recall ever having rhogam. Has appt dr. Sherlene Shams.     Review of Systems   Constitutional: No fever, chills or weakness   Skin: No rash or diaphoresis  HENT: No headaches, or congestion  Eyes: No vision changes or photophobia   Cardio: No chest pain, palpitations or leg swelling   Respiratory: No cough, wheezing or SOB  GI:  No nausea, vomiting or stool changes  GU:  No dysuria, hematuria, or increased frequency, + pink DC.   MSK: No muscle aches, joint + back pain  Neuro: No seizures, LOC, numbness, tingling, or focal weakness  Psychiatric: No depression, SI or substance abuse  All other systems reviewed and are negative.    Historical Data   History Reviewed This Encounter:  All noted and reviewed.     Physical Exam   ED Triage Vitals [06/07/21 1514]   BP    Heart Rate 91   Respiratory Rate (!) 10   Temperature 36.3 C (97.3 F)   SpO2 98 %   Weight 47.6 kg (105 lb)   Height 1.549 m (_0 )       Constitutional:  31 y.o. female who appears in no distress. Normal color, no cyanosis.   HENT:   Head: Normocephalic and atraumatic.   Mouth/Throat: Oropharynx is clear and moist.   Eyes: EOMI, PERRL   Neck: Trachea midline. Neck supple.  Cardiovascular: RRR, No murmurs, rubs or gallops. Intact distal pulses.  Pulmonary/Chest: BS equal bilaterally. No respiratory distress. No wheezes, rales or chest tenderness.   Abdominal: Bowel sounds present and normal. Abdomen soft, no tenderness, no rebound and no guarding.  Back: No midline spinal tenderness, no paraspinal  tenderness, no CVA tenderness.           Musculoskeletal: No edema, tenderness or deformity.  Skin: warm and dry. No rash, erythema, pallor or cyanosis  Psychiatric: normal mood and affect. Behavior is normal.   Neurological: Patient keenly alert and responsive, easily able to raise eyebrows, facial muscles/expressions symmetric, speaking in fluent sentences, moving all extremities equally and fully, normal gait  Patient Data     Labs Ordered/Reviewed   HCG, PLASMA OR SERUM QUANTITATIVE, PREGNANCY - Abnormal; Notable for the following components:       Result Value    HCG QUANTITATIVE PREGNANCY 15,330 (*)     All other components within normal limits    Narrative:                                       HCG EXPECTED VALUES    NON PREGNANT FEMALE  FEMALE ADULT    THE CONCENTRATION OF HCG RISES RAPIDLY DURING EARLY PREGNANCY. A MAXIMUM LEVEL OF 5,000 to 200,000 mIU/mL IS REACHED AT 10-12 WEEKS, FOLLOWED BY A SLOW DECLINE TO LEVELS OF 1,000 TO 50,000 mIU/mL DURING THE THIRD TRIMESTER.    HCG RESULTS BETWEEN  5 mIU/mL AND 25 mIU/mL MAY BE INDICATIVE OF EARLY PREGNANCY BUT SHOULD BE INTERPRETED IN LIGHT OF THE TOTAL CLINICAL PRESENTATION OF THE PATIENT.    WHEN BORDERLINE RESULTS ARE ENCOUNTERED, PATIENT SAMPLES SHOULD BE REDRAWM 48 HOURS LATER.    Weeks Post LMP                   Approximate hCG                                    Range (mIU/mL)    0.2-1 WK                           5-50              mIU/mL        1-2   WK                           50-500            mIU/mL  2-3   WK                           100-5,000         mIU/mL  3-4   WK                           500-10,000        mIU/mL  4-5   WK                           1,000-50,000      mIU/mL  5-6   WK                           10,000-100,000    mIU/mL  6-8   WK                           15,000-200,000    mIU/mL  2-3   MONTHS                       10,000-100,000    mIU/mL    ref: Clinical Guide to Laboratory Tests 3rd Edition Terrilee Croak.     COMPREHENSIVE  METABOLIC PANEL, NON-FASTING - Abnormal; Notable for the following components:    ALKALINE PHOSPHATASE 34 (*)     All other components within normal limits    Narrative:     Estimated Glomerular Filtration Rate (eGFR) is calculated using the CKD-EPI (2021) equation, intended for patients 41 years of age and older. If gender is not documented or "unknown", there will be no eGFR calculation.   CBC WITH DIFF - Abnormal; Notable for the following components:    RDW 15.7 (*)     LYMPHOCYTE % 23 (*)     LYMPHOCYTE # 1.58 (*)     All other components within normal limits   URINALYSIS, MACRO/MICRO - Normal   URINALYSIS WITH REFLEX MICROSCOPIC AND CULTURE IF POSITIVE    Narrative:     The following orders were created for panel order URINALYSIS WITH REFLEX MICROSCOPIC AND CULTURE IF POSITIVE.  Procedure  Abnormality         Status                     ---------                               -----------         ------                     URINALYSIS, MACRO/MICRO[[id number]]      Normal              Final result                 Please view results for these tests on the individual orders.   CBC/DIFF    Narrative:     The following orders were created for panel order CBC/DIFF.  Procedure                               Abnormality         Status                     ---------                               -----------         ------                     CBC WITH NUUV[[id number]]                Abnormal            Final result                 Please view results for these tests on the individual orders.   ABO & RH     No orders to display     Medical Decision Making    discussed with dr. Riki Sheer no pelvic pain. Can follow up for ultrasound , return if  Abd pain .    MDM       Implantation. Ectopic, miscarriage.      Clinical Impression   Pregnancy, unspecified gestational age (Primary)   Back pain, unspecified back location, unspecified back pain laterality, unspecified chronicity       Disposition:  Discharged

## 2021-06-07 NOTE — Discharge Instructions (Addendum)
You have a beta HCG [place] of [address].  If you developed any bleeding or pain in belly, pelvis, you may need an ultrasound and more work up. You are always welcome to return here but we dont have ultrasound this service is offered at [facility]. Dr. Abel Presto can do it in his office as well. Avoid sex. Drink plenty of water . Start prenatal vitamins.

## 2021-06-07 NOTE — ED Triage Notes (Signed)
Light pink vaginal bleeding 5-6 week  OB

## 2021-06-07 NOTE — ED Nurses Note (Signed)
Patient discharged home with family.  AVS reviewed with patient/care giver.  A written copy of the AVS and discharge instructions was given to the patient/care giver.  Questions sufficiently answered as needed.  Patient/care giver encouraged to follow up with PCP as indicated.  In the event of an emergency, patient/care giver instructed to call 911 or go to the nearest emergency room.

## 2021-06-16 ENCOUNTER — Encounter (HOSPITAL_COMMUNITY): Payer: Self-pay | Admitting: Physician Assistant

## 2021-06-23 LAB — HIV1/HIV2 SCREEN, COMBINED ANTIGEN AND ANTIBODY: HIV SCREEN, COMBINED ANTIGEN & ANTIBODY: NONREACTIVE

## 2021-06-23 LAB — CHLAMYDIA TRACHOMATIS/NEISSERIA GONORRHOEAE RNA, NAAT
CHLAMYDIA TRACHOMATIS RNA: NEGATIVE
NEISSERIA GONORRHEA GC RNA: NEGATIVE

## 2021-06-23 LAB — HEPATITIS B SURFACE ANTIGEN: HEPATITIS B SURFACE AG: NEGATIVE

## 2021-06-23 LAB — HEPATITIS C ANTIBODY SCREEN WITH REFLEX TO HCV PCR: HEPATITIS C ANTIBODY: REACTIVE

## 2021-06-23 LAB — SYPHILIS SCREENING ALGORITHM WITH REFLEX, SERUM: SYPHILIS TP ANTIBODIES: NONREACTIVE

## 2021-06-23 LAB — RUBELLA VIRUS ANTIBODIES, IGG, SERUM: RUBELLA IGG QUALITATIVE: IMMUNE

## 2021-07-15 ENCOUNTER — Encounter (INDEPENDENT_AMBULATORY_CARE_PROVIDER_SITE_OTHER): Payer: Self-pay | Admitting: OBSTETRICS/GYNECOLOGY

## 2021-07-15 ENCOUNTER — Other Ambulatory Visit (INDEPENDENT_AMBULATORY_CARE_PROVIDER_SITE_OTHER): Payer: Medicaid Other

## 2021-07-15 ENCOUNTER — Ambulatory Visit (INDEPENDENT_AMBULATORY_CARE_PROVIDER_SITE_OTHER): Payer: Medicaid Other | Admitting: OBSTETRICS/GYNECOLOGY

## 2021-07-15 ENCOUNTER — Other Ambulatory Visit: Payer: Self-pay

## 2021-07-15 VITALS — BP 126/74 | Wt 110.0 lb

## 2021-07-15 DIAGNOSIS — O99619 Diseases of the digestive system complicating pregnancy, unspecified trimester: Secondary | ICD-10-CM

## 2021-07-15 DIAGNOSIS — O0991 Supervision of high risk pregnancy, unspecified, first trimester: Secondary | ICD-10-CM

## 2021-07-15 DIAGNOSIS — Z3A11 11 weeks gestation of pregnancy: Secondary | ICD-10-CM

## 2021-07-15 DIAGNOSIS — K5900 Constipation, unspecified: Secondary | ICD-10-CM

## 2021-07-15 NOTE — Progress Notes (Signed)
[facility]  [address]  [place] [address]       RETURN OBSTETRICAL VISIT     Name: Patricia Santiago MRN:  [id number]   Date: 07/15/2021 Age: 31 y.o.     Patricia Santiago is a 31 y.o. [phone]. Patient reports follow up she is experiencing some intermittent constipation      PE:  Abdomen soft nondistended nontender normoactive bowel sounds    FHT:  140  Vaginal/Cervical exam:  Deferred    There is no problem list on file for this patient.      Data Reviewed:   Gestational age appropriate labs  and [place] reviewed see procedure note       Assessment     ICD-10-CM    1. High risk pregnancy case management patient in first trimester  O09.91 POCT Urine Dipstick     US FETAL TRANSABDOMINAL 1ST TRI< 14 WKS  90300     [place] PREGNANT UTERUS, FETAL & MATERNAL EVALUATION, 1ST TRIMESTER, TRANSABDOMINAL; UP TO 5 GESTATIONS (AMB ONLY)            Plan:  Return in about 2 weeks (around 07/29/2021) for ultrasound.  She will have her noninvasive prenatal test performed with the next visit I discussed with her management of constipation provided handout for management    Remonia Richter, DO     This note was partially generated using MModal Fluency Direct system, and there may be some incorrect words, spellings, and punctuation that were not noted in checking the note before saving.

## 2021-07-15 NOTE — Procedures (Signed)
[facility]  [address] [place] [address]    Procedure Note    Name: Patricia Santiago MRN:  [id number]   Date: 07/15/2021 Age: 32 y.o.       [place] PREGNANT UTERUS, FETAL & MATERNAL EVALUATION, 1ST TRIMESTER, TRANSABDOMINAL; UP TO 5 GESTATIONS (AMB ONLY)  Performed by: Remonia Richter, DO  Authorized by: Remonia Richter, DO     Time Out:      Immediately before the procedure, a time out was called:  Yes      Patient verified:  Yes      Procedure verified:  Yes      Site verified:  Yes  Procedure Details:      Number of gestations:  1       Documentation:  Ultrasound  report with measurements reviewed.  Ultrasound appropriate for gestational age  with a viable intrauterine pregnancy   Follow up in 2 weeks for noninvasive prenatal test          Remonia Richter, DO

## 2021-07-15 NOTE — Nursing Note (Signed)
07/15/21 1723   Vital Signs   Weight 49.9 kg (110 lb)   BP (Non-Invasive) 126/74   OB Exam   Edema Negative   # of Fetuses 1   FHR (1) 152   Presentation Cephalic   OB Exam Comments constipation   POCT Urine Results   Glucose Negative   Protein Negative   Ketones Negative   Initials (nurse) JMN

## 2021-07-15 NOTE — Nursing Note (Signed)
C/o constipation,

## 2021-07-29 ENCOUNTER — Ambulatory Visit (INDEPENDENT_AMBULATORY_CARE_PROVIDER_SITE_OTHER): Payer: Medicaid Other | Admitting: OBSTETRICS/GYNECOLOGY

## 2021-07-29 ENCOUNTER — Ambulatory Visit: Payer: Medicaid Other | Attending: OBSTETRICS/GYNECOLOGY

## 2021-07-29 ENCOUNTER — Encounter (INDEPENDENT_AMBULATORY_CARE_PROVIDER_SITE_OTHER): Payer: Self-pay | Admitting: OBSTETRICS/GYNECOLOGY

## 2021-07-29 ENCOUNTER — Other Ambulatory Visit: Payer: Self-pay

## 2021-07-29 VITALS — Ht 61.0 in

## 2021-07-29 DIAGNOSIS — Z3482 Encounter for supervision of other normal pregnancy, second trimester: Secondary | ICD-10-CM | POA: Insufficient documentation

## 2021-07-29 DIAGNOSIS — O0990 Supervision of high risk pregnancy, unspecified, unspecified trimester: Secondary | ICD-10-CM | POA: Insufficient documentation

## 2021-07-29 DIAGNOSIS — O0993 Supervision of high risk pregnancy, unspecified, third trimester: Secondary | ICD-10-CM | POA: Insufficient documentation

## 2021-07-29 DIAGNOSIS — B182 Chronic viral hepatitis C: Secondary | ICD-10-CM

## 2021-07-29 DIAGNOSIS — Z98891 History of uterine scar from previous surgery: Secondary | ICD-10-CM

## 2021-07-29 DIAGNOSIS — O0992 Supervision of high risk pregnancy, unspecified, second trimester: Secondary | ICD-10-CM | POA: Insufficient documentation

## 2021-07-29 DIAGNOSIS — Z3A12 12 weeks gestation of pregnancy: Secondary | ICD-10-CM

## 2021-07-29 DIAGNOSIS — O98411 Viral hepatitis complicating pregnancy, first trimester: Secondary | ICD-10-CM

## 2021-07-29 NOTE — Nursing Note (Signed)
Follow up OB    Patricia Santiago, CMA

## 2021-07-29 NOTE — Progress Notes (Signed)
[facility], [facility]  [address]  [place] [address]       RETURN OBSTETRICAL VISIT     Name: Patricia Santiago MRN:  [id number]   Date: 07/29/2021 Age: 31 y.o.     Patricia Santiago is a 31 y.o. [phone].   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 High-risk pregnancy   . History of cesarean delivery   . Chronic active hepatitis C (CMS [place])       Data Reviewed:   Gestational age appropriate labs will and [place] reviewed see procedure note  RMHOBLABS: New OB Labs    Lab Results   Component Value Date    ABORHD A POSITIVE 06/07/2021    HGB 13.2 06/07/2021    HCT 37.1 06/07/2021    WBC 6.9 06/07/2021    PLTCNT 293 06/07/2021    HEPBSURFAG Negative 06/23/2021    HEPCAB Reactive 06/23/2021    RHIVAB Non-reactive 06/23/2021    RUBIGGQUAL Immune 06/23/2021    CTRACHRNA Negative 06/23/2021    GCRNA Negative 06/23/2021        Assessment     ICD-10-CM    1. High-risk pregnancy  O09.90       2. 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 History of cesarean delivery  Z98.891       4. Chronic active hepatitis C (CMS HCC)  B18.2             Plan:  Return in about 2 weeks (around 08/12/2021).  Discussed hepatitis-C with pregnancy precautions given.  I discussed the case with Dr. Posey Pronto ;discussed her positive viral load; he will evaluate her for postpartum treatment     Conley Rolls, DO     This note was partially generated using MModal Fluency Direct system, and there may be some incorrect words, spellings, and punctuation that were not noted in  checking the note before saving.

## 2021-08-13 ENCOUNTER — Other Ambulatory Visit: Payer: Self-pay

## 2021-08-19 ENCOUNTER — Ambulatory Visit (INDEPENDENT_AMBULATORY_CARE_PROVIDER_SITE_OTHER): Payer: Medicaid Other | Admitting: OBSTETRICS/GYNECOLOGY

## 2021-08-19 ENCOUNTER — Other Ambulatory Visit: Payer: Self-pay

## 2021-08-19 ENCOUNTER — Encounter (INDEPENDENT_AMBULATORY_CARE_PROVIDER_SITE_OTHER): Payer: Self-pay | Admitting: OBSTETRICS/GYNECOLOGY

## 2021-08-19 VITALS — BP 127/80 | HR 71 | Ht 61.0 in | Wt 113.0 lb

## 2021-08-19 DIAGNOSIS — O99891 Other specified diseases and conditions complicating pregnancy: Secondary | ICD-10-CM

## 2021-08-19 DIAGNOSIS — O0990 Supervision of high risk pregnancy, unspecified, unspecified trimester: Secondary | ICD-10-CM

## 2021-08-19 DIAGNOSIS — O200 Threatened abortion: Secondary | ICD-10-CM | POA: Insufficient documentation

## 2021-08-19 DIAGNOSIS — R519 Headache, unspecified: Secondary | ICD-10-CM

## 2021-08-19 NOTE — Nursing Note (Signed)
Follow up OB c/o headaches    Evette Cristal, CMA

## 2021-08-19 NOTE — Progress Notes (Signed)
OB/[facility], [facility]  [address]  [place] [address]       RETURN OBSTETRICAL VISIT     Name: Patricia Santiago MRN:  [id number]   Date: 08/19/2021 Age: 31 y.o.     Patricia Santiago is a 31 y.o. [phone].   Nursing Notes:   Jasmine Pang, [place]  08/19/21 1604  Signed  Follow up OB c/o headaches    Jasmine Pang, CMA     Patient reports routine OB increased headaches      PE:  Weight: 51.3 kg (113 lb)  BP (Non-Invasive): 127/80  # of Fetuses: 1  Fundal Height: 18  Edema: Negative  FHR (1): 140  OB Exam Comments: HA    Fundal Height:18 cm  Vaginal/Cervical exam:  Deferred    Patient Active Problem List   Diagnosis   . High-risk pregnancy   . History of cesarean delivery   . Chronic active hepatitis C (CMS [place])   . Threatened abortion in second trimester       Data Reviewed:   Gestational age appropriate labs reviewed with the patient and [place] reviewed see procedure note  RMHOBLABS: New OB Labs    Lab Results   Component Value Date    ABORHD A POSITIVE 06/07/2021    HGB 13.2 06/07/2021    HCT 37.1 06/07/2021    WBC 6.9 06/07/2021    PLTCNT 293 06/07/2021    HEPBSURFAG Negative 06/23/2021    HEPCAB Reactive 06/23/2021    RHIVAB Non-reactive 06/23/2021    RUBIGGQUAL Immune 06/23/2021    CTRACHRNA Negative 06/23/2021    GCRNA Negative 06/23/2021        Assessment     ICD-10-CM    1. High-risk pregnancy  O09.90       2. Threatened abortion in second trimester  O20.0             Plan:  Return in about 3 weeks (around 09/09/2021).  Alpha fetoprotein treatment of headaches discussed with the patient    Conley Rolls, DO     This note was partially generated using MModal Fluency Direct system, and there may be some incorrect words, spellings, and punctuation that were not noted in checking the note before saving.

## 2021-09-10 ENCOUNTER — Other Ambulatory Visit (INDEPENDENT_AMBULATORY_CARE_PROVIDER_SITE_OTHER): Payer: Medicaid Other

## 2021-09-10 ENCOUNTER — Other Ambulatory Visit: Payer: Self-pay

## 2021-09-10 ENCOUNTER — Ambulatory Visit (INDEPENDENT_AMBULATORY_CARE_PROVIDER_SITE_OTHER): Payer: Medicaid Other | Admitting: OBSTETRICS/GYNECOLOGY

## 2021-09-10 VITALS — BP 118/74 | Wt 116.0 lb

## 2021-09-10 DIAGNOSIS — R519 Headache, unspecified: Secondary | ICD-10-CM

## 2021-09-10 DIAGNOSIS — R1030 Lower abdominal pain, unspecified: Secondary | ICD-10-CM

## 2021-09-10 DIAGNOSIS — M25552 Pain in left hip: Secondary | ICD-10-CM

## 2021-09-10 DIAGNOSIS — Z3A19 19 weeks gestation of pregnancy: Secondary | ICD-10-CM

## 2021-09-10 DIAGNOSIS — Z86018 Personal history of other benign neoplasm: Secondary | ICD-10-CM

## 2021-09-10 DIAGNOSIS — O200 Threatened abortion: Secondary | ICD-10-CM

## 2021-09-10 DIAGNOSIS — O0992 Supervision of high risk pregnancy, unspecified, second trimester: Secondary | ICD-10-CM

## 2021-09-10 DIAGNOSIS — O99891 Other specified diseases and conditions complicating pregnancy: Secondary | ICD-10-CM

## 2021-09-24 ENCOUNTER — Encounter (INDEPENDENT_AMBULATORY_CARE_PROVIDER_SITE_OTHER): Payer: Self-pay | Admitting: OBSTETRICS/GYNECOLOGY

## 2021-09-25 ENCOUNTER — Other Ambulatory Visit: Payer: Self-pay

## 2021-09-25 ENCOUNTER — Ambulatory Visit (INDEPENDENT_AMBULATORY_CARE_PROVIDER_SITE_OTHER): Payer: Medicaid Other | Admitting: OBSTETRICS/GYNECOLOGY

## 2021-09-25 ENCOUNTER — Other Ambulatory Visit (INDEPENDENT_AMBULATORY_CARE_PROVIDER_SITE_OTHER): Payer: Medicaid Other

## 2021-09-25 VITALS — BP 131/72 | HR 78 | Ht 61.0 in | Wt 123.0 lb

## 2021-09-25 DIAGNOSIS — O3660X0 Maternal care for excessive fetal growth, unspecified trimester, not applicable or unspecified: Secondary | ICD-10-CM

## 2021-09-25 DIAGNOSIS — Z3A20 20 weeks gestation of pregnancy: Secondary | ICD-10-CM

## 2021-09-25 DIAGNOSIS — O321XX0 Maternal care for breech presentation, not applicable or unspecified: Secondary | ICD-10-CM

## 2021-09-25 DIAGNOSIS — O0992 Supervision of high risk pregnancy, unspecified, second trimester: Secondary | ICD-10-CM

## 2021-09-26 ENCOUNTER — Encounter (INDEPENDENT_AMBULATORY_CARE_PROVIDER_SITE_OTHER): Payer: Self-pay | Admitting: OBSTETRICS/GYNECOLOGY

## 2021-09-26 DIAGNOSIS — O3660X0 Maternal care for excessive fetal growth, unspecified trimester, not applicable or unspecified: Secondary | ICD-10-CM | POA: Insufficient documentation

## 2021-09-26 NOTE — Procedures (Signed)
[facility]  [address] [place] [address]    Procedure Note    Name: Patricia Santiago MRN:  [id number]   Date: 09/25/2021 Age: 31 y.o.       [place] PREGNANT UTERUS, FETAL & MATERNAL, AFTER 1ST TRIMESTER, TRANSABDOMINAL; UP TO 5 GESTATIONS (AMB ONLY)  Performed by: Remonia Richter, DO  Authorized by: Remonia Richter, DO     Time Out:      Immediately before the procedure, a time out was called:  Yes      Patient verified:  Yes      Procedure verified:  Yes      Site verified:  Yes  Procedure Details:      Number of gestations:  1       Documentation:  Ultrasound  report with measurements reviewed. & signed. Results scanned to imaging order.   Interpretation:  Ultrasound reviewed and discussed with the patient          Remonia Richter, DO

## 2021-10-01 ENCOUNTER — Encounter (INDEPENDENT_AMBULATORY_CARE_PROVIDER_SITE_OTHER): Payer: Self-pay | Admitting: OBSTETRICS/GYNECOLOGY

## 2021-10-01 NOTE — Progress Notes (Signed)
[facility]  [address]  [place] [address]       RETURN OBSTETRICAL VISIT     Name: Patricia Santiago MRN:  [id number]   Date: 09/10/2021 Age: 31 y.o.     Patricia Santiago is a 31 y.o. [phone].   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 High-risk pregnancy   . History of cesarean delivery   . Chronic active hepatitis C (CMS HCC)   . Threatened abortion in second trimester   . Large for gestational age fetus affecting management of mother       Data Reviewed:   Gestational age appropriate labs reviewed and [place] reviewed see procedure note  RMHOBLABS: New OB Labs    Lab Results   Component Value Date    ABORHD A POSITIVE 06/07/2021    HGB 13.2 06/07/2021    HCT 37.1 06/07/2021    WBC 6.9 06/07/2021    PLTCNT 293 06/07/2021    HEPBSURFAG Negative 06/23/2021    HEPCAB Reactive 06/23/2021    RHIVAB Non-reactive 06/23/2021    RUBIGGQUAL Immune 06/23/2021    CTRACHRNA Negative 06/23/2021    GCRNA Negative 06/23/2021        Assessment     ICD-10-CM    1. Threatened abortion in second trimester  O20.0 MATERNAL SERUM AFP     US FETAL TRANSABDOMINAL LIMITED 63893     73428 - ULTRASOUND, PREGNANT UTERUS, REAL TIME W/ IMAGE DOCUMENTATION, LIMITED, 1 OR MORE FETUSES (AMB ONLY)     POCT Urine Dipstick      2. High risk pregnancy case management patient in second trimester  O09.92         complete obstetrical ultrasound was performed she  would ultrasound for ultrasound for anatomical survey in 2 weeks  Discussed lipoma excision  Plan:  Follow up 2 weeks  No follow-ups on file.     Remonia Richter, DO     This note was partially generated using MModal Fluency Direct system, and there may be some incorrect words, spellings, and punctuation that were not noted in checking the note before saving.

## 2021-10-01 NOTE — Procedures (Signed)
[facility]  [address] [place] [address]    Procedure Note    Name: Patricia Santiago MRN:  [id number]   Date: 09/10/2021 Age: 31 y.o.       [phone] - ULTRASOUND, PREGNANT UTERUS, REAL TIME W/ IMAGE DOCUMENTATION, LIMITED, 1 OR MORE FETUSES (AMB ONLY)  Performed by: Remonia Richter, DO  Authorized by: Remonia Richter, DO     Time Out:     Immediately before the procedure, a time out was called:  Yes    Patient verified:  Yes    Procedure Verified:  Yes    Site Verified:  Yes  Documentation:      Ultrasound  report with measurements reviewed. & signed. Results scanned to imaging order.   Interpretation:   Ulcerative ultrasound performed and reviewed average gestational age [redacted] weeks placenta posterior grade 0        Remonia Richter, DO

## 2021-10-06 ENCOUNTER — Other Ambulatory Visit: Payer: Self-pay

## 2021-10-06 ENCOUNTER — Ambulatory Visit (INDEPENDENT_AMBULATORY_CARE_PROVIDER_SITE_OTHER): Payer: Medicaid Other | Admitting: OBSTETRICS/GYNECOLOGY

## 2021-10-06 ENCOUNTER — Encounter (INDEPENDENT_AMBULATORY_CARE_PROVIDER_SITE_OTHER): Payer: Self-pay | Admitting: OBSTETRICS/GYNECOLOGY

## 2021-10-06 ENCOUNTER — Other Ambulatory Visit (INDEPENDENT_AMBULATORY_CARE_PROVIDER_SITE_OTHER): Payer: Medicaid Other

## 2021-10-06 VITALS — BP 139/64 | Wt 125.0 lb

## 2021-10-06 DIAGNOSIS — Z3009 Encounter for other general counseling and advice on contraception: Secondary | ICD-10-CM

## 2021-10-06 DIAGNOSIS — O0992 Supervision of high risk pregnancy, unspecified, second trimester: Secondary | ICD-10-CM

## 2021-10-06 DIAGNOSIS — O3662X0 Maternal care for excessive fetal growth, second trimester, not applicable or unspecified: Secondary | ICD-10-CM

## 2021-10-06 DIAGNOSIS — Z98891 History of uterine scar from previous surgery: Secondary | ICD-10-CM

## 2021-10-06 DIAGNOSIS — O3660X0 Maternal care for excessive fetal growth, unspecified trimester, not applicable or unspecified: Secondary | ICD-10-CM

## 2021-10-06 DIAGNOSIS — Z3A23 23 weeks gestation of pregnancy: Secondary | ICD-10-CM

## 2021-10-06 DIAGNOSIS — O34219 Maternal care for unspecified type scar from previous cesarean delivery: Secondary | ICD-10-CM

## 2021-10-06 NOTE — Nursing Note (Signed)
C/o pulling and stretching  Thayer Dallas, LPN

## 2021-10-06 NOTE — Progress Notes (Signed)
[facility]  [address]  [place] [address]       RETURN OBSTETRICAL VISIT     Name: Patricia Santiago MRN:  [id number]   Date: 10/06/2021 Age: 31 y.o.     Patricia Santiago is a 31 y.o. [phone].   Nursing Notes:   Patricia Dallas, LPN  74/14/23 9532  Signed  C/o pulling and stretching  Patricia Dallas, LPN       Patient reports follow up OB has been desire elective in permanent sterilization with the next cesarean section which B 4.      PE:  Weight: 56.7 kg (125 lb)  BP (Non-Invasive): 139/64  # of Fetuses: 1  Protein: Negative  Glucose: Negative  Ketones: Negative  Presentation: Cephalic  Edema: Negative  FHR (1): 145  OB Exam Comments: pulling and stretching    Fundal Height:  23 cm  Vaginal/Cervical exam deferred    Patient Active Problem List   Diagnosis   . High-risk pregnancy   . History of cesarean delivery   . Chronic active hepatitis C (CMS HCC)   . Threatened abortion in second trimester   . Large for gestational age fetus affecting management of mother       Data Reviewed:  For  Gestational age appropriate labs reviewed and [place] reviewed see procedure note  RMHOBLABS: New OB Labs    Lab Results   Component Value Date    ABORHD A POSITIVE 06/07/2021    HGB 13.2 06/07/2021    HCT 37.1 06/07/2021    WBC 6.9 06/07/2021    PLTCNT 293 06/07/2021    HEPBSURFAG Negative 06/23/2021    HEPCAB Reactive 06/23/2021    RHIVAB Non-reactive 06/23/2021    RUBIGGQUAL Immune 06/23/2021    CTRACHRNA Negative 06/23/2021    GCRNA Negative 06/23/2021        Assessment     ICD-10-CM    1. High-risk pregnancy in second trimester  O09.92 US FETAL TRANSABD F/U 02334 W EA ADD [phone]     TRANSABD US, PREG UTERUS, REAL TIME W IMAGE DOCUMENTATION, F/U; UP TO 5 FETUSES (AMB ONLY)      2. Large for gestational age fetus affecting management of mother  O77.60X0 US FETAL TRANSABD F/U 16837 W EA ADD [phone]     TRANSABD US, PREG UTERUS, REAL TIME W IMAGE DOCUMENTATION, F/U; UP TO 5 FETUSES (AMB ONLY)       3. History of 3 cesarean sections  Z98.891       4. Sterilization consult  Z30.09           Ultrasound performed which was within normal limits there was no pericardial effusion noted she will follow up in 2 weeks for ultrasound for interval growth  Plan:  Return in about 2 weeks (around 10/20/2021) for ultrasound.     Patricia Richter, DO     This note was partially generated using MModal Fluency Direct system, and there may be some incorrect words, spellings, and punctuation that were not noted in checking the note before saving.

## 2021-10-06 NOTE — Procedures (Signed)
[facility]  [address] [place] [address]    Procedure Note    Name: Patricia Santiago MRN:  [id number]   Date: 10/06/2021 Age: 31 y.o.       TRANSABD US, PREG UTERUS, REAL TIME W IMAGE DOCUMENTATION, F/U; UP TO 5 FETUSES (AMB ONLY)  Performed by: Remonia Richter, DO  Authorized by: Remonia Richter, DO     Immediately before the procedure, a time out was called:  Yes  Patient verified:  Yes  Procedure Verified:  Yes  Site Verified:  Yes  Number of Fetuses:  1   Ultrasound  report with measurements reviewed. & signed. Results scanned to imaging order.   Interpretation:  Ultrasound reviewed and discussed with the patient fetal heart appeared to be normal 4 chamber view no pericardial effusion          Remonia Richter, DO

## 2021-10-28 ENCOUNTER — Other Ambulatory Visit (INDEPENDENT_AMBULATORY_CARE_PROVIDER_SITE_OTHER): Payer: Medicaid Other

## 2021-10-28 ENCOUNTER — Ambulatory Visit (INDEPENDENT_AMBULATORY_CARE_PROVIDER_SITE_OTHER): Payer: Medicaid Other | Admitting: OBSTETRICS/GYNECOLOGY

## 2021-10-28 ENCOUNTER — Other Ambulatory Visit: Payer: Self-pay

## 2021-10-28 VITALS — BP 146/77 | Wt 130.0 lb

## 2021-10-28 DIAGNOSIS — O3662X0 Maternal care for excessive fetal growth, second trimester, not applicable or unspecified: Secondary | ICD-10-CM

## 2021-10-28 DIAGNOSIS — O26892 Other specified pregnancy related conditions, second trimester: Secondary | ICD-10-CM

## 2021-10-28 DIAGNOSIS — R42 Dizziness and giddiness: Secondary | ICD-10-CM

## 2021-10-28 DIAGNOSIS — Z3A26 26 weeks gestation of pregnancy: Secondary | ICD-10-CM

## 2021-10-28 DIAGNOSIS — O0992 Supervision of high risk pregnancy, unspecified, second trimester: Secondary | ICD-10-CM

## 2021-10-28 DIAGNOSIS — O219 Vomiting of pregnancy, unspecified: Secondary | ICD-10-CM

## 2021-10-28 NOTE — Nursing Note (Signed)
C/o having spells/episodes of having nausea and feeling lightheaded when eating certain foods,  + FM  Thayer Dallas, LPN

## 2021-10-29 ENCOUNTER — Encounter (INDEPENDENT_AMBULATORY_CARE_PROVIDER_SITE_OTHER): Payer: Self-pay | Admitting: OBSTETRICS/GYNECOLOGY

## 2021-10-31 ENCOUNTER — Encounter (INDEPENDENT_AMBULATORY_CARE_PROVIDER_SITE_OTHER): Payer: Self-pay | Admitting: OBSTETRICS/GYNECOLOGY

## 2021-10-31 NOTE — Procedures (Signed)
[facility]  [address] [place] [address]    Procedure Note    Name: Patricia Santiago MRN:  [id number]   Date: 10/28/2021 Age: 31 y.o.       [place] PREGNANT UTERUS, FETAL & MATERNAL, AFTER 1ST TRIMESTER, TRANSABDOMINAL; UP TO 5 GESTATIONS (AMB ONLY)  Performed by: Remonia Richter, DO  Authorized by: Remonia Richter, DO     Time Out:      Immediately before the procedure, a time out was called:  Yes      Patient verified:  Yes      Procedure verified:  Yes      Site verified:  Yes  Procedure Details:      Number of gestations:  1       Documentation:  Ultrasound  report with measurements reviewed. & signed. Results scanned to imaging order.   Interpretation:  Reviewed and discussed with the patient        Remonia Richter, DO

## 2021-10-31 NOTE — Progress Notes (Signed)
[facility]  [address]  [place] [address]       RETURN OBSTETRICAL VISIT     Name: Patricia Santiago MRN:  [id number]   Date: 10/28/2021 Age: 31 y.o.     Patricia Santiago is a 31 y.o. [phone].   Nursing Notes:   Thayer Dallas, LPN  69/79/48 0165  Sign at exiting of workspace  C/o having spells/episodes of having nausea and feeling lightheaded when eating certain foods,  + FM  Jacqueline McCoy-Neal, LPN     Patient reports routine OB follow up      PE:  Weight: 59 kg (130 lb)  BP (Non-Invasive): (!) 146/77  # of Fetuses: 1  Protein: Negative  Glucose: Negative  Ketones: Negative  Fundal Height: 27  Fetal Movement: Present  Edema: Negative  FHR (1): 150  OB Exam Comments: nausea and lightheaded when eating certain foods,    Fundal Height:27 cm  Vaginal/Cervical exam:  For    Patient Active Problem List   Diagnosis    High-risk pregnancy    History of cesarean delivery    Chronic active hepatitis C (CMS HCC)    Threatened abortion in second trimester    Large for gestational age fetus affecting management of mother       Data Reviewed:   Gestational age appropriate labs reviewed and [place] reviewed see procedure note  RMHOBLABS: New OB Labs    Lab Results   Component Value Date    ABORHD A POSITIVE 06/07/2021    HGB 13.2 06/07/2021    HCT 37.1 06/07/2021    WBC 6.9 06/07/2021    PLTCNT 293 06/07/2021    HEPBSURFAG Negative 06/23/2021    HEPCAB Reactive 06/23/2021    RHIVAB Non-reactive 06/23/2021    RUBIGGQUAL Immune 06/23/2021    CTRACHRNA Negative 06/23/2021    GCRNA Negative 06/23/2021        Assessment     ICD-10-CM    1. High-risk pregnancy in second trimester  O09.92 POCT Urine Dipstick     US FETAL TRANSABD 2ND/3RD TRI>14 WKS 53748 W EA ADD [phone]     [place] PREGNANT UTERUS, FETAL & MATERNAL, AFTER 1ST TRIMESTER, TRANSABDOMINAL; UP TO 5 GESTATIONS (AMB ONLY)      2. Excessive fetal growth affecting management of pregnancy in second trimester, single or unspecified fetus  O36.62X0  POCT Urine Dipstick     US FETAL TRANSABD 2ND/3RD TRI>14 WKS 67544 W EA ADD [phone]     [place] PREGNANT UTERUS, FETAL & MATERNAL, AFTER 1ST TRIMESTER, TRANSABDOMINAL; UP TO 5 GESTATIONS (AMB ONLY)          Obstetrical ultrasound performed as described discussed management of nausea and vomiting pregnancy and dizziness she will follow up 2 weeks every 28 week labs next  Plan:  No follow-ups on file.     Remonia Richter, DO     This note was partially generated using MModal Fluency Direct system, and there may be some incorrect words, spellings, and punctuation that were not noted in checking the note before saving.

## 2021-11-05 NOTE — Progress Notes (Signed)
[facility]  [address]  [place] [address]       RETURN OBSTETRICAL VISIT     Name: Patricia Santiago MRN:  [id number]   Date: 09/25/2021 Age: 31 y.o.     Patricia Santiago is a 31 y.o. [phone].   There are no exam notes on file for this visit.   Patient reports routine OB follow up      PE:  Weight: 55.8 kg (123 lb)  BP (Non-Invasive): 131/72  # of Fetuses: 1  Protein: Negative  Glucose: Negative  Ketones: Negative  Fundal Height: 22  Presentation: Breech  Edema: Negative  FHR (1): 153  OB Exam Comments: HA  22 cm  Fundal Height:  Vaginal/Cervical exam:  Deferred    Patient Active Problem List   Diagnosis    High-risk pregnancy    History of cesarean delivery    Chronic active hepatitis C (CMS HCC)    Threatened abortion in second trimester    Large for gestational age fetus affecting management of mother       Data Reviewed:   Gestational age appropriate labs reviewed and [place] reviewed see procedure note  RMHOBLABS: New OB Labs    Lab Results   Component Value Date    ABORHD A POSITIVE 06/07/2021    HGB 13.2 06/07/2021    HCT 37.1 06/07/2021    WBC 6.9 06/07/2021    PLTCNT 293 06/07/2021    HEPBSURFAG Negative 06/23/2021    HEPCAB Reactive 06/23/2021    RHIVAB Non-reactive 06/23/2021    RUBIGGQUAL Immune 06/23/2021    CTRACHRNA Negative 06/23/2021    GCRNA Negative 06/23/2021      and OB Optional Labs    Lab Results   Component Value Date    HCGQUANTPREG 15,330 (H) 06/07/2021        Assessment     ICD-10-CM    1. High-risk pregnancy in second trimester  O09.92 US FETAL TRANSABD 2ND/3RD TRI>14 WKS 24401 W EA ADD [phone]     [place] PREGNANT UTERUS, FETAL & MATERNAL, AFTER 1ST TRIMESTER, TRANSABDOMINAL; UP TO 5 GESTATIONS (AMB ONLY)     POCT URINE DIPSTICK      2. Large for gestational age fetus affecting management of mother  O106.60X0             Plan:  Return in about 4 weeks (around 10/23/2021) for Follow up OB ultrasound.     Remonia Richter, DO     This note was partially generated using MModal  Fluency Direct system, and there may be some incorrect words, spellings, and punctuation that were not noted in checking the note before saving.

## 2021-11-19 ENCOUNTER — Ambulatory Visit: Payer: Medicaid Other | Attending: OBSTETRICS/GYNECOLOGY

## 2021-11-19 ENCOUNTER — Other Ambulatory Visit (INDEPENDENT_AMBULATORY_CARE_PROVIDER_SITE_OTHER): Payer: Medicaid Other

## 2021-11-19 ENCOUNTER — Other Ambulatory Visit: Payer: Self-pay

## 2021-11-19 ENCOUNTER — Ambulatory Visit (INDEPENDENT_AMBULATORY_CARE_PROVIDER_SITE_OTHER): Payer: Medicaid Other | Admitting: OBSTETRICS/GYNECOLOGY

## 2021-11-19 VITALS — BP 126/81 | Wt 135.0 lb

## 2021-11-19 DIAGNOSIS — O0993 Supervision of high risk pregnancy, unspecified, third trimester: Secondary | ICD-10-CM

## 2021-11-19 DIAGNOSIS — O4703 False labor before 37 completed weeks of gestation, third trimester: Secondary | ICD-10-CM

## 2021-11-19 DIAGNOSIS — R519 Headache, unspecified: Secondary | ICD-10-CM

## 2021-11-19 DIAGNOSIS — B182 Chronic viral hepatitis C: Secondary | ICD-10-CM | POA: Insufficient documentation

## 2021-11-19 DIAGNOSIS — Z3A29 29 weeks gestation of pregnancy: Secondary | ICD-10-CM

## 2021-11-19 DIAGNOSIS — O200 Threatened abortion: Secondary | ICD-10-CM | POA: Insufficient documentation

## 2021-11-19 LAB — COMPREHENSIVE METABOLIC PANEL, NON-FASTING
ALBUMIN/GLOBULIN RATIO: 1.3 (ref 0.8–1.4)
ALBUMIN: 3.5 g/dL (ref 3.5–5.7)
ALKALINE PHOSPHATASE: 62 U/L (ref 34–104)
ALT (SGPT): 17 U/L (ref 7–52)
ANION GAP: 12 mmol/L (ref 4–13)
AST (SGOT): 22 U/L (ref 13–39)
BILIRUBIN TOTAL: 0.5 mg/dL (ref 0.3–1.2)
BUN/CREA RATIO: 13 (ref 6–22)
BUN: 7 mg/dL (ref 7–25)
CALCIUM, CORRECTED: 9 mg/dL (ref 8.9–10.8)
CALCIUM: 8.5 mg/dL — ABNORMAL LOW (ref 8.6–10.3)
CHLORIDE: 104 mmol/L (ref 98–107)
CO2 TOTAL: 21 mmol/L (ref 21–31)
CREATININE: 0.54 mg/dL — ABNORMAL LOW (ref 0.60–1.30)
ESTIMATED GFR: 126 mL/min/{1.73_m2} (ref >59)
GLOBULIN: 2.8 — ABNORMAL LOW (ref 2.9–5.4)
GLUCOSE: 128 mg/dL — ABNORMAL HIGH (ref 74–109)
OSMOLALITY, CALCULATED: 274 mOsm/kg (ref 270–290)
POTASSIUM: 2.9 mmol/L — ABNORMAL LOW (ref 3.5–5.1)
PROTEIN TOTAL: 6.3 g/dL — ABNORMAL LOW (ref 6.4–8.9)
SODIUM: 137 mmol/L (ref 136–145)

## 2021-11-19 LAB — ABO/RH AND ANTIBODY SCREEN
ABO/RH(D): A POS
ANTIBODY SCREEN: NEGATIVE

## 2021-11-19 LAB — GLUCOSE TOLERANCE TEST (GTT), 1 HOUR
GLUCOSE 1 HR POST DOSE: 127 mg/dL (ref 120–170)
WEIGHT OF DEXTROSE IN THE GLUCOLA GIVEN: 50 g

## 2021-11-19 NOTE — Nursing Note (Signed)
C/o headaches, pressure,  contractions x 1 hour about 5 days, ago,  + FM  [employer], LPN

## 2021-11-20 LAB — CBC
HCT: 27.8 % — ABNORMAL LOW (ref 37.0–47.0)
HGB: 9.4 g/dL — ABNORMAL LOW (ref 12.5–16.0)
MCH: 29.4 pg (ref 27.0–32.0)
MCHC: 33.9 g/dL (ref 32.0–36.0)
MCV: 86.6 fL (ref 78.0–99.0)
MPV: 7.9 fL (ref 7.4–10.4)
PLATELETS: 328 10*3/uL (ref 140–440)
RBC: 3.21 10*6/uL — ABNORMAL LOW (ref 4.20–5.40)
RDW: 14.2 % (ref 11.6–14.8)
WBC: 11.3 10*3/uL — ABNORMAL HIGH (ref 4.0–10.5)
WBCS UNCORRECTED: 11.3 10*3/uL

## 2021-11-21 LAB — URINE CULTURE: URINE CULTURE: 2000 — AB

## 2021-11-30 ENCOUNTER — Encounter (INDEPENDENT_AMBULATORY_CARE_PROVIDER_SITE_OTHER): Payer: Self-pay | Admitting: OBSTETRICS/GYNECOLOGY

## 2021-11-30 DIAGNOSIS — O4703 False labor before 37 completed weeks of gestation, third trimester: Secondary | ICD-10-CM | POA: Insufficient documentation

## 2021-11-30 NOTE — Procedures (Signed)
OB/GYN, [place]  [address]  [place] [address]    Procedure Note    Name: Patricia Santiago MRN:  [id number]   Date: 11/19/2021 Age: 31 y.o.  DOB:   1991-03-05       31540 - FETAL NON STRESS TEST (AMB ONLY)  Performed by: Conley Rolls, DO  Authorized by: Conley Rolls, DO     Time Out:     Immediately before the procedure, a time out was called:  Yes    Patient verified:  Yes    Procedure Verified:  Yes    Site Verified:  Yes  Documentation:      NST completed see scanned strip   Baseline fetal heart rate  120 to 130s   good beat to beat variability   Positive Accelerations   Category  1  Decelerations  none  Contractions  none      Conley Rolls, DO

## 2021-11-30 NOTE — Progress Notes (Signed)
OB/GYN, [place]  [address]  [place] [address]       RETURN OBSTETRICAL VISIT     Name: Patricia Santiago MRN:  [id number]   Date: 11/19/2021 Age: 31 y.o.     Patricia Santiago is a 31 y.o. [phone].   Nursing Notes:   Colon Flattery, LPN  37/10/62 6948  Signed  C/o headaches, pressure,  contractions x 1 hour about 5 days, ago,  + FM  Jacqueline McCoy-Neal, LPN     Patient reports  follow up OB      PE:  Weight: 61.2 kg (135 lb)  BP (Non-Invasive): 126/81  # of Fetuses: 1  Protein: Negative  Glucose: Negative  Ketones: Negative  Fundal Height: 32  Fetal Movement: Present  Presentation: Cephalic  Edema: Negative  FHR (1): 141  OB Exam Comments: headaches, pressure,  contraction x 1 hour about 5 days ago,    Fundal Height: 32 cm  Vaginal/Cervical exam:  closed long    Patient Active Problem List   Diagnosis    Supervision of high risk pregnancy in third trimester    History of cesarean delivery    Chronic active hepatitis C (CMS HCC)    Threatened abortion in second trimester    Large for gestational age fetus affecting management of mother    False labor before 75 completed weeks of gestation in third trimester       Data Reviewed:   Gestational age appropriate labs  reviewed and [place] reviewed see procedure note  RMHOBLABS: OB 24-28 Week Labs     Lab Results   Component Value Date    ABORHD A POSITIVE 11/19/2021    ABSCR NEGATIVE 11/19/2021    HGB 9.4 (L) 11/20/2021    HCT 27.8 (L) 11/20/2021    WBC 11.3 (H) 11/20/2021    PLTCNT 328 11/20/2021    GLT1 127 11/19/2021        Assessment     ICD-10-CM    1. Supervision of high risk pregnancy in third trimester  O09.93 ABO/RH AND ANTIBODY SCREEN     CBC     GLUCOSE TOLERANCE TEST (GTT), 1 HOUR     URINE CULTURE     US FETAL TRANSABD 2ND/3RD TRI>14 WKS 54627 W EA ADD [phone]     [place] PREGNANT UTERUS, FETAL & MATERNAL, AFTER 1ST TRIMESTER, TRANSABDOMINAL; UP TO 5 GESTATIONS (AMB ONLY)     POCT Urine Dipstick      2. False labor before 37 completed  weeks of gestation in third trimester  O47.03 59025 - FETAL NON STRESS TEST (AMB ONLY)           Nonstress test and obstetrical ultrasound performed as described I recommended she follow up in 1 week precautions  Plan:  No follow-ups on file.     Conley Rolls, DO  11/30/2021 12:41       This note was partially generated using MModal Fluency Direct system, and there may be some incorrect words, spellings, and punctuation that were not noted in checking the note before saving.

## 2021-11-30 NOTE — Procedures (Signed)
OB/GYN, [place]  [address]  [place] [address]    Procedure Note    Name: Patricia Santiago MRN:  [id number]   Date: 11/19/2021 Age: 31 y.o.  DOB:   [birth year]       [place] PREGNANT UTERUS, FETAL & MATERNAL, AFTER 1ST TRIMESTER, TRANSABDOMINAL; UP TO 5 GESTATIONS (AMB ONLY)  Performed by: Conley Rolls, DO  Authorized by: Conley Rolls, DO     Time Out:      Immediately before the procedure, a time out was called:  Yes      Patient verified:  Yes      Procedure verified:  Yes      Site verified:  Yes  Procedure Details:      Number of gestations:  1       Documentation:  Ultrasound  report with measurements reviewed. & signed. Results scanned to imaging order.   Interpretation:  average gestational age [redacted] weeks and 5 days  estimated fetal weight is 1355 g 63%cervix 38.7 mm length amniotic fluid is 12.8 cm    Conley Rolls, DO

## 2021-12-03 ENCOUNTER — Ambulatory Visit (INDEPENDENT_AMBULATORY_CARE_PROVIDER_SITE_OTHER): Payer: Medicaid Other | Admitting: OBSTETRICS/GYNECOLOGY

## 2021-12-03 ENCOUNTER — Other Ambulatory Visit (INDEPENDENT_AMBULATORY_CARE_PROVIDER_SITE_OTHER): Payer: Medicaid Other

## 2021-12-03 ENCOUNTER — Other Ambulatory Visit: Payer: Self-pay

## 2021-12-03 VITALS — BP 155/72 | Wt 138.0 lb

## 2021-12-03 DIAGNOSIS — O0993 Supervision of high risk pregnancy, unspecified, third trimester: Secondary | ICD-10-CM

## 2021-12-03 DIAGNOSIS — Z3A32 32 weeks gestation of pregnancy: Secondary | ICD-10-CM

## 2021-12-03 DIAGNOSIS — O4703 False labor before 37 completed weeks of gestation, third trimester: Secondary | ICD-10-CM

## 2021-12-03 DIAGNOSIS — M7989 Other specified soft tissue disorders: Secondary | ICD-10-CM

## 2021-12-03 NOTE — Nursing Note (Signed)
C/o feet swelling, cramping pressure,  few irregular contractions,  + FMC  Colon Flattery, LPN

## 2021-12-04 ENCOUNTER — Encounter (INDEPENDENT_AMBULATORY_CARE_PROVIDER_SITE_OTHER): Payer: Self-pay | Admitting: OBSTETRICS/GYNECOLOGY

## 2021-12-04 NOTE — Procedures (Signed)
OB/GYN, [place]  [address]  [place] [address]    Procedure Note    Name: Patricia Santiago MRN:  [id number]   Date: 12/03/2021 Age: 31 y.o.  DOB:   [birth year]       17001 - FETAL NON STRESS TEST (AMB ONLY)  Performed by: Conley Rolls, DO  Authorized by: Conley Rolls, DO     Time Out:     Immediately before the procedure, a time out was called:  Yes    Patient verified:  Yes    Procedure Verified:  Yes    Site Verified:  Yes  Documentation:      NST completed see scanned strip   Baseline fetal heart rate  130   good beat to beat variability   Positive Accelerations   Category  1  Decelerations  mild variables  Contractions  none      Conley Rolls, DO

## 2021-12-04 NOTE — Procedures (Signed)
OB/GYN, [place]  [address]  [place] [address]    Procedure Note    Name: Patricia Santiago MRN:  [id number]   Date: 12/03/2021 Age: 31 y.o.  DOB:   1990-09-26       TRANSABD US, PREG UTERUS, REAL TIME W IMAGE DOCUMENTATION, F/U; UP TO 5 FETUSES (AMB ONLY)  Performed by: Conley Rolls, DO  Authorized by: Conley Rolls, DO     Immediately before the procedure, a time out was called:  Yes  Patient verified:  Yes  Procedure Verified:  Yes  Site Verified:  Yes  Number of Fetuses:  1   Ultrasound  report with measurements reviewed. & signed. Results scanned to imaging order.   Interpretation:  reviewed and discussed with the patient needs interval growth in a week        Conley Rolls, DO

## 2021-12-04 NOTE — Progress Notes (Signed)
OB/GYN, [place]  [address]  [place] [address]       RETURN OBSTETRICAL VISIT     Name: Patricia Santiago MRN:  [id number]   Date: 12/03/2021 Age: 31 y.o.     Patricia Santiago is a 31 y.o. [phone].   Nursing Notes:   Colon Flattery, LPN  20/94/70 9628  Signed  C/o feet swelling, cramping pressure,  few irregular contractions,  + FMC  Jacqueline McCoy-Neal, LPN     Patient reports  follow up OB      PE:  Weight: 62.6 kg (138 lb)  BP (Non-Invasive): (!) 155/72  # of Fetuses: 1  Protein: Negative  Glucose: Negative  Ketones: Negative  Fundal Height: 31  Fetal Movement: Present  Presentation: Cephalic  Edema: Trace  FHR (1): 136  OB Exam Comments: feet swelling, cramping pressure, few irregular contractions,    Fundal Height: 31 cm  Vaginal/Cervical exam:  closed long    Patient Active Problem List   Diagnosis    Supervision of high risk pregnancy in third trimester    History of cesarean delivery    Chronic active hepatitis C (CMS [place])    Threatened abortion in second trimester    Large for gestational age fetus affecting management of mother    False labor before 43 completed weeks of gestation in third trimester       Data Reviewed:   Gestational age appropriate labs  reviewed and [place] reviewed see procedure note  RMHOBLABS: New OB Labs    Lab Results   Component Value Date    ABORHD A POSITIVE 11/19/2021    ABSCR NEGATIVE 11/19/2021    HGB 9.4 (L) 11/20/2021    HCT 27.8 (L) 11/20/2021    WBC 11.3 (H) 11/20/2021    PLTCNT 328 11/20/2021    HEPBSURFAG Negative 06/23/2021    HEPCAB Reactive 06/23/2021    RHIVAB Non-reactive 06/23/2021    RUBIGGQUAL Immune 06/23/2021    CTRACHRNA Negative 06/23/2021    GCRNA Negative 06/23/2021     , OB Optional Labs    Lab Results   Component Value Date    HCGQUANTPREG 15,330 (H) 06/07/2021     , and OB 24-28 Week Labs     Lab Results   Component Value Date    ABORHD A POSITIVE 11/19/2021    ABSCR NEGATIVE 11/19/2021    HGB 9.4 (L) 11/20/2021    HCT 27.8  (L) 11/20/2021    WBC 11.3 (H) 11/20/2021    PLTCNT 328 11/20/2021    GLT1 127 11/19/2021        Assessment     ICD-10-CM    1. Supervision of high risk pregnancy in third trimester  O09.93 POCT Urine Dipstick     59025 - FETAL NON STRESS TEST (AMB ONLY)     US FETAL TRANSABD F/U 36629 W EA ADD [phone]     TRANSABD US, PREG UTERUS, REAL TIME W IMAGE DOCUMENTATION, F/U; UP TO 5 FETUSES (AMB ONLY)      2. False labor before 37 completed weeks of gestation in third trimester  O47.03             Plan:   Nonstress test obstetrical ultrasound was performed as described given her precautions discussed swelling in her lower extremities follow up 1 week nonstress test ultrasound    Conley Rolls, DO  12/04/2021 08:40       This note was partially generated using MModal Fluency Direct system, and  there may be some incorrect words, spellings, and punctuation that were not noted in checking the note before saving.

## 2021-12-17 ENCOUNTER — Encounter (INDEPENDENT_AMBULATORY_CARE_PROVIDER_SITE_OTHER): Payer: Self-pay | Admitting: OBSTETRICS/GYNECOLOGY

## 2021-12-17 ENCOUNTER — Ambulatory Visit (INDEPENDENT_AMBULATORY_CARE_PROVIDER_SITE_OTHER): Payer: Medicaid Other | Admitting: OBSTETRICS/GYNECOLOGY

## 2021-12-17 ENCOUNTER — Other Ambulatory Visit: Payer: Self-pay

## 2021-12-17 ENCOUNTER — Other Ambulatory Visit (INDEPENDENT_AMBULATORY_CARE_PROVIDER_SITE_OTHER): Payer: Medicaid Other

## 2021-12-17 VITALS — BP 132/84 | Wt 137.0 lb

## 2021-12-17 DIAGNOSIS — O4703 False labor before 37 completed weeks of gestation, third trimester: Secondary | ICD-10-CM

## 2021-12-17 DIAGNOSIS — O365930 Maternal care for other known or suspected poor fetal growth, third trimester, not applicable or unspecified: Secondary | ICD-10-CM

## 2021-12-17 DIAGNOSIS — O34211 Maternal care for low transverse scar from previous cesarean delivery: Secondary | ICD-10-CM

## 2021-12-17 DIAGNOSIS — O0993 Supervision of high risk pregnancy, unspecified, third trimester: Secondary | ICD-10-CM

## 2021-12-17 NOTE — Progress Notes (Signed)
OB/GYN, [place]  [address]  [place] [address]       RETURN OBSTETRICAL VISIT     Name: Patricia Santiago MRN:  [id number]   Date: 12/17/2021 Age: 31 y.o.     Patricia Santiago is a 31 y.o. [phone].   Nursing Notes:   Colon Flattery, LPN  09/32/67 1245  Signed  C/o pressure, + FMC 10 minutes  Colon Flattery, LPN     Patient reports follow up OB been having lower abdominal pressure cramping      PE:  Weight: 62.1 kg (137 lb)  BP (Non-Invasive): 132/84  # of Fetuses: 1  Protein: Negative  Glucose: Negative  Ketones: Negative  Fundal Height: 32  Fetal Movement: Present  Presentation: Cephalic  Edema: Negative  FHR (1): 136  OB Exam Comments: pressure    Fundal Height:  32 cm  Vaginal/Cervical exam:  Closed long    Patient Active Problem List   Diagnosis    Supervision of high risk pregnancy in third trimester    History of cesarean delivery    Chronic active hepatitis C (CMS HCC)    Threatened abortion in second trimester    Large for gestational age fetus affecting management of mother    False labor before 33 completed weeks of gestation in third trimester    Small for gestational age (SGA)       Data Reviewed:   Gestational age appropriate labs reviewed and [place] reviewed see procedure note  RMHOBLABS: OB 32-36 Week Labs    Lab Results   Component Value Date    HGB 9.4 (L) 11/20/2021    HCT 27.8 (L) 11/20/2021    ABORHD A POSITIVE 11/19/2021    ABSCR NEGATIVE 11/19/2021        Assessment     ICD-10-CM    1. Supervision of high risk pregnancy in third trimester  O09.93 59025 - FETAL NON STRESS TEST (AMB ONLY)     US FETAL TRANSABD 2ND/3RD TRI>14 WKS 80998 W EA ADD [phone]     [place] PREGNANT UTERUS, FETAL & MATERNAL, AFTER 1ST TRIMESTER, TRANSABDOMINAL; UP TO 5 GESTATIONS (AMB ONLY)     POCT Urine Dipstick      2. Small for gestational age (SGA)  P05.10 [phone] - FETAL NON STRESS TEST (AMB ONLY)     US FETAL TRANSABD 2ND/3RD TRI>14 WKS 67341 W EA ADD [phone]     [place] PREGNANT UTERUS, FETAL &  MATERNAL, AFTER 1ST TRIMESTER, TRANSABDOMINAL; UP TO 5 GESTATIONS (AMB ONLY)     POCT Urine Dipstick      3. False labor before 37 completed weeks of gestation in third trimester  O47.03 59025 - FETAL NON STRESS TEST (AMB ONLY)     US FETAL TRANSABD 2ND/3RD TRI>14 WKS 24097 W EA ADD [phone]     [place] PREGNANT UTERUS, FETAL & MATERNAL, AFTER 1ST TRIMESTER, TRANSABDOMINAL; UP TO 5 GESTATIONS (AMB ONLY)     POCT Urine Dipstick            Plan:  Nonstress test and obstetrical ultrasound was performed as described recommend follow up 1 week will scheduled for repeat low-transverse cesarean section at 39 weeks  Return in about 1 week (around 12/24/2021) for OB .     Conley Rolls, DO  12/17/2021 16:50       This note was partially generated using MModal Fluency Direct system, and there may be some incorrect words, spellings, and punctuation that were not noted in checking  the note before saving.

## 2021-12-17 NOTE — Procedures (Signed)
OB/GYN, [place]  [address]  [place] [address]    Procedure Note    Name: Patricia Santiago MRN:  [id number]   Date: 12/17/2021 Age: 31 y.o.  DOB:   1990-08-11       83151 - FETAL NON STRESS TEST (AMB ONLY)    Performed by: Conley Rolls, DO  Authorized by: Conley Rolls, DO    Time Out:     Immediately before the procedure, a time out was called:  Yes    Patient verified:  Yes    Procedure Verified:  Yes    Site Verified:  Yes  Documentation:      Nonstress test reveals a baseline fetal heart rate of 120s good beat-to-beat variability accelerations no pathologic decelerations category 1 no contractions noted      Conley Rolls, DO

## 2021-12-17 NOTE — Nursing Note (Signed)
C/o pressure, + FMC 10 minutes  Colon Flattery, LPN

## 2021-12-17 NOTE — Procedures (Signed)
OB/GYN, [place]  [address]  [place] [address]    Procedure Note    Name: TEIRA ARCILLA MRN:  [id number]   Date: 12/17/2021 Age: 31 y.o.  DOB:   06-11-1990       [place] PREGNANT UTERUS, FETAL & MATERNAL, AFTER 1ST TRIMESTER, TRANSABDOMINAL; UP TO 5 GESTATIONS (AMB ONLY)    Performed by: Conley Rolls, DO  Authorized by: Conley Rolls, DO    Time Out:      Immediately before the procedure, a time out was called:  Yes      Patient verified:  Yes      Procedure verified:  Yes      Site verified:  Yes  Procedure Details:      Number of gestations:  1       Documentation:  Ultrasound  report with measurements reviewed. & signed. Results scanned to imaging order.   Interpretation:  Reviewed and discussed with the patient          Conley Rolls, DO

## 2021-12-24 ENCOUNTER — Other Ambulatory Visit: Payer: Self-pay

## 2021-12-24 ENCOUNTER — Other Ambulatory Visit (INDEPENDENT_AMBULATORY_CARE_PROVIDER_SITE_OTHER): Payer: Medicaid Other

## 2021-12-24 ENCOUNTER — Ambulatory Visit (INDEPENDENT_AMBULATORY_CARE_PROVIDER_SITE_OTHER): Payer: Medicaid Other | Admitting: OBSTETRICS/GYNECOLOGY

## 2021-12-24 ENCOUNTER — Encounter (INDEPENDENT_AMBULATORY_CARE_PROVIDER_SITE_OTHER): Payer: Self-pay | Admitting: OBSTETRICS/GYNECOLOGY

## 2021-12-24 VITALS — BP 167/90 | Wt 137.4 lb

## 2021-12-24 DIAGNOSIS — R300 Dysuria: Secondary | ICD-10-CM

## 2021-12-24 DIAGNOSIS — O4703 False labor before 37 completed weeks of gestation, third trimester: Secondary | ICD-10-CM

## 2021-12-24 DIAGNOSIS — O0993 Supervision of high risk pregnancy, unspecified, third trimester: Secondary | ICD-10-CM

## 2021-12-24 NOTE — Nursing Note (Unsigned)
Follow up OB    C/o burning with urination    Last Friday she started having severe pain/pressure in the pelvic/vaginal and anal area  She states she was unable to lift her feet and was having to shuffle to walk,lasted until Sunday    +FMC    Alicianna Litchford, LPN

## 2021-12-31 ENCOUNTER — Other Ambulatory Visit (INDEPENDENT_AMBULATORY_CARE_PROVIDER_SITE_OTHER): Payer: Medicaid Other

## 2021-12-31 ENCOUNTER — Ambulatory Visit (INDEPENDENT_AMBULATORY_CARE_PROVIDER_SITE_OTHER): Payer: Medicaid Other | Admitting: OBSTETRICS/GYNECOLOGY

## 2021-12-31 ENCOUNTER — Encounter (INDEPENDENT_AMBULATORY_CARE_PROVIDER_SITE_OTHER): Payer: Self-pay | Admitting: OBSTETRICS/GYNECOLOGY

## 2021-12-31 ENCOUNTER — Other Ambulatory Visit: Payer: Self-pay

## 2021-12-31 VITALS — BP 136/82 | Wt 141.5 lb

## 2021-12-31 DIAGNOSIS — O0993 Supervision of high risk pregnancy, unspecified, third trimester: Secondary | ICD-10-CM

## 2021-12-31 DIAGNOSIS — O4703 False labor before 37 completed weeks of gestation, third trimester: Secondary | ICD-10-CM

## 2021-12-31 NOTE — Procedures (Signed)
OB/GYN, [place]  [address]  [place] [address]    Procedure Note    Name: Patricia Santiago MRN:  [id number]   Date: 12/31/2021 Age: 31 y.o.  DOB:   Sep 26, 1990       87579 - ULTRASOUND, PREGNANT UTERUS, REAL TIME W/ IMAGE DOCUMENTATION, LIMITED, 1 OR MORE FETUSES (AMB ONLY)    Performed by: Conley Rolls, DO  Authorized by: Conley Rolls, DO    Time Out:     Immediately before the procedure, a time out was called:  Yes    Patient verified:  Yes    Procedure Verified:  Yes    Site Verified:  Yes  Documentation:      Ultrasound  report with measurements reviewed. & signed. Results scanned to imaging order.   Interpretation:  reviewed and discussed with the patient          Conley Rolls, DO

## 2021-12-31 NOTE — Procedures (Signed)
OB/GYN, [place]  [address]  [place] [address]    Procedure Note    Name: Patricia Santiago MRN:  [id number]   Date: 12/31/2021 Age: 31 y.o.  DOB:   November 10, 1990       27062 - FETAL NON STRESS TEST (AMB ONLY)    Performed by: Conley Rolls, DO  Authorized by: Conley Rolls, DO    Time Out:     Immediately before the procedure, a time out was called:  Yes    Patient verified:  Yes    Procedure Verified:  Yes    Site Verified:  Yes  Documentation:       Nonstress test baseline fetal heart rate 120's good beat-to-beat variability accelerations category 1 no decelerations regular uterine contractions 3 minutes with irritability    Conley Rolls, DO

## 2021-12-31 NOTE — Progress Notes (Signed)
OB/GYN, [place]  [address]  [place] [address]       RETURN OBSTETRICAL VISIT     Name: ALAZAY LEICHT MRN:  [id number]   Date: 12/31/2021 Age: 31 y.o.     ZALMA CHANNING is a 31 y.o. [phone].   Nursing Notes:   Ernie Avena, LPN  27/74/12 8786  Signed  Follow up OB    C/o lower back pain    +FMC    Summer Price, LPN     Patient reports  follow up OB lower abdominal cramping  back pain    PE:  Weight: 64.2 kg (141 lb 8 oz)  BP (Non-Invasive): 136/82  # of Fetuses: 1  Protein: Negative  Glucose: Negative  Ketones: Negative  Fundal Height: 34  Fetal Movement: Present  Presentation: Cephalic  Edema: Negative  FHR (1): 121  OB Exam Comments: back pain    Fundal Height: 32 cm  Vaginal/Cervical exam:  closed 50% -1    Patient Active Problem List   Diagnosis    Supervision of high risk pregnancy in third trimester    History of cesarean delivery    Chronic active hepatitis C (CMS HCC)    Threatened abortion in second trimester    Large for gestational age fetus affecting management of mother    False labor before 32 completed weeks of gestation in third trimester    Small for gestational age (SGA)       Data Reviewed:   Gestational age appropriate labs  reviewed and [place] reviewed see procedure note  RMHOBLABS: OB 32-36 Week Labs    Lab Results   Component Value Date    HGB 9.4 (L) 11/20/2021    HCT 27.8 (L) 11/20/2021    ABORHD A POSITIVE 11/19/2021    ABSCR NEGATIVE 11/19/2021        Assessment     ICD-10-CM    1. Supervision of high risk pregnancy in third trimester  O09.93 US FETAL TRANSABDOMINAL LIMITED [phone]     94709 - ULTRASOUND, PREGNANT UTERUS, REAL TIME W/ IMAGE DOCUMENTATION, LIMITED, 1 OR MORE FETUSES (AMB ONLY)      2. False labor before 37 completed weeks of gestation in third trimester  O47.03 US FETAL TRANSABDOMINAL LIMITED 62836     62947 - ULTRASOUND, PREGNANT UTERUS, REAL TIME W/ IMAGE DOCUMENTATION, LIMITED, 1 OR MORE FETUSES (AMB ONLY)      3. Small for gestational age  (SGA)  P2.10             Plan: nonstress test and limited obstetrical ultrasound performed described discussed her back pain she was having some preterm contractions I have given her precautions she will follow up in the office next week  Return in about 1 week (around 01/07/2022) for OB  NST/US.     Conley Rolls, DO  12/31/2021 16:46       This note was partially generated using MModal Fluency Direct system, and there may be some incorrect words, spellings, and punctuation that were not noted in checking the note before saving.

## 2021-12-31 NOTE — Nursing Note (Signed)
Follow up OB    C/o lower back pain    +FMC    Ernie Avena, LPN

## 2022-01-07 ENCOUNTER — Other Ambulatory Visit (INDEPENDENT_AMBULATORY_CARE_PROVIDER_SITE_OTHER): Payer: Medicaid Other

## 2022-01-07 ENCOUNTER — Ambulatory Visit (INDEPENDENT_AMBULATORY_CARE_PROVIDER_SITE_OTHER): Payer: Medicaid Other | Admitting: OBSTETRICS/GYNECOLOGY

## 2022-01-07 ENCOUNTER — Other Ambulatory Visit: Payer: Medicaid Other | Attending: OBSTETRICS/GYNECOLOGY | Admitting: OBSTETRICS/GYNECOLOGY

## 2022-01-07 ENCOUNTER — Encounter (INDEPENDENT_AMBULATORY_CARE_PROVIDER_SITE_OTHER): Payer: Self-pay | Admitting: OBSTETRICS/GYNECOLOGY

## 2022-01-07 ENCOUNTER — Other Ambulatory Visit: Payer: Self-pay

## 2022-01-07 VITALS — BP 138/80 | Wt 144.0 lb

## 2022-01-07 DIAGNOSIS — O0993 Supervision of high risk pregnancy, unspecified, third trimester: Secondary | ICD-10-CM | POA: Insufficient documentation

## 2022-01-07 DIAGNOSIS — O4703 False labor before 37 completed weeks of gestation, third trimester: Secondary | ICD-10-CM

## 2022-01-07 NOTE — Nursing Note (Signed)
C/o cramping, pressure,tightening  CTX 5-7 min apart on Sunday    +FMC    Patricia Bednarz, LPN

## 2022-01-09 LAB — GROUP B STREPTOCOCCUS DNA BY NAAT: GROUP B STREPTOCOCCUS (GBS) DNA BY NAAT: NEGATIVE

## 2022-01-14 ENCOUNTER — Ambulatory Visit (INDEPENDENT_AMBULATORY_CARE_PROVIDER_SITE_OTHER): Payer: Medicaid Other | Admitting: OBSTETRICS/GYNECOLOGY

## 2022-01-14 ENCOUNTER — Ambulatory Visit: Payer: Medicaid Other | Attending: OBSTETRICS/GYNECOLOGY

## 2022-01-14 ENCOUNTER — Other Ambulatory Visit: Payer: Self-pay

## 2022-01-14 ENCOUNTER — Other Ambulatory Visit (INDEPENDENT_AMBULATORY_CARE_PROVIDER_SITE_OTHER): Payer: Medicaid Other

## 2022-01-14 ENCOUNTER — Encounter (INDEPENDENT_AMBULATORY_CARE_PROVIDER_SITE_OTHER): Payer: Self-pay | Admitting: OBSTETRICS/GYNECOLOGY

## 2022-01-14 VITALS — BP 145/96 | Wt 141.0 lb

## 2022-01-14 DIAGNOSIS — O4703 False labor before 37 completed weeks of gestation, third trimester: Secondary | ICD-10-CM | POA: Insufficient documentation

## 2022-01-14 DIAGNOSIS — O0993 Supervision of high risk pregnancy, unspecified, third trimester: Secondary | ICD-10-CM | POA: Insufficient documentation

## 2022-01-14 DIAGNOSIS — O0992 Supervision of high risk pregnancy, unspecified, second trimester: Secondary | ICD-10-CM

## 2022-01-14 DIAGNOSIS — Z3A36 36 weeks gestation of pregnancy: Secondary | ICD-10-CM

## 2022-01-14 LAB — COMPREHENSIVE METABOLIC PANEL, NON-FASTING
ALBUMIN/GLOBULIN RATIO: 1.1 (ref 0.8–1.4)
ALBUMIN: 3.5 g/dL (ref 3.5–5.7)
ALKALINE PHOSPHATASE: 176 U/L — ABNORMAL HIGH (ref 34–104)
ALT (SGPT): 17 U/L (ref 7–52)
ANION GAP: 9 mmol/L (ref 4–13)
AST (SGOT): 24 U/L (ref 13–39)
BILIRUBIN TOTAL: 0.6 mg/dL (ref 0.3–1.2)
BUN/CREA RATIO: 17 (ref 6–22)
BUN: 9 mg/dL (ref 7–25)
CALCIUM, CORRECTED: 9 mg/dL (ref 8.9–10.8)
CALCIUM: 8.5 mg/dL — ABNORMAL LOW (ref 8.6–10.3)
CHLORIDE: 104 mmol/L (ref 98–107)
CO2 TOTAL: 23 mmol/L (ref 21–31)
CREATININE: 0.54 mg/dL — ABNORMAL LOW (ref 0.60–1.30)
ESTIMATED GFR: 126 mL/min/{1.73_m2} (ref >59)
GLOBULIN: 3.1 (ref 2.9–5.4)
GLUCOSE: 102 mg/dL (ref 74–109)
OSMOLALITY, CALCULATED: 271 mOsm/kg (ref 270–290)
POTASSIUM: 3.7 mmol/L (ref 3.5–5.1)
PROTEIN TOTAL: 6.6 g/dL (ref 6.4–8.9)
SODIUM: 136 mmol/L (ref 136–145)

## 2022-01-14 LAB — CBC WITH DIFF
BASOPHIL #: 0.1 10*3/uL (ref 0.00–0.10)
BASOPHIL %: 1 % (ref 0–1)
EOSINOPHIL #: 0.1 10*3/uL (ref 0.00–0.50)
EOSINOPHIL %: 1 %
HCT: 29.2 % — ABNORMAL LOW (ref 31.2–41.9)
HGB: 9.3 g/dL — ABNORMAL LOW (ref 10.9–14.3)
LYMPHOCYTE #: 1.6 10*3/uL (ref 1.00–3.00)
LYMPHOCYTE %: 16 % (ref 16–44)
MCH: 24.2 pg — ABNORMAL LOW (ref 24.7–32.8)
MCHC: 31.7 g/dL — ABNORMAL LOW (ref 32.3–35.6)
MCV: 76.3 fL (ref 75.5–95.3)
MONOCYTE #: 0.7 10*3/uL (ref 0.30–1.00)
MONOCYTE %: 7 % (ref 5–13)
MPV: 8.1 fL (ref 7.9–10.8)
NEUTROPHIL #: 7.6 10*3/uL (ref 1.85–7.80)
NEUTROPHIL %: 76 % (ref 43–77)
PLATELETS: 294 10*3/uL (ref 140–440)
RBC: 3.83 10*6/uL (ref 3.63–4.92)
RDW: 17.3 % (ref 12.3–17.7)
WBC: 10 10*3/uL (ref 3.8–11.8)

## 2022-01-14 LAB — PROTEIN/CREATININE RATIO, URINE, RANDOM
CREATININE RANDOM URINE: 110 mg/dL — ABNORMAL HIGH (ref 11–26)
PROTEIN RANDOM URINE: 23 mg/dL — ABNORMAL LOW (ref 50–80)
PROTEIN/CREATININE RATIO RANDOM URINE: 0.209 mg/mg (ref 0.000–200.000)

## 2022-01-14 LAB — URIC ACID: URIC ACID: 4.9 mg/dL (ref 2.3–7.6)

## 2022-01-14 NOTE — Nursing Note (Signed)
Follow up OB  C/o cramping, CTX pain 7-9/10, edema in feet    States she has been unable to walk well on her own due to the pain and has been having to waddle and have her husband help her get in and out of bed and the car x 1 week    Also has said she is having severe burning in her c-section scar from past pregnancies    BP is elevated 162/92,  145/96    +FMC    Ernie Avena, LPN

## 2022-01-15 ENCOUNTER — Encounter (INDEPENDENT_AMBULATORY_CARE_PROVIDER_SITE_OTHER): Payer: Self-pay | Admitting: OBSTETRICS/GYNECOLOGY

## 2022-01-15 ENCOUNTER — Ambulatory Visit (INDEPENDENT_AMBULATORY_CARE_PROVIDER_SITE_OTHER): Payer: Medicaid Other | Admitting: OBSTETRICS/GYNECOLOGY

## 2022-01-15 VITALS — BP 140/80 | Wt 146.5 lb

## 2022-01-15 DIAGNOSIS — O0993 Supervision of high risk pregnancy, unspecified, third trimester: Secondary | ICD-10-CM

## 2022-01-15 DIAGNOSIS — O0992 Supervision of high risk pregnancy, unspecified, second trimester: Secondary | ICD-10-CM

## 2022-01-15 DIAGNOSIS — Z3A36 36 weeks gestation of pregnancy: Secondary | ICD-10-CM

## 2022-01-15 DIAGNOSIS — O139 Gestational [pregnancy-induced] hypertension without significant proteinuria, unspecified trimester: Secondary | ICD-10-CM

## 2022-01-15 DIAGNOSIS — O133 Gestational [pregnancy-induced] hypertension without significant proteinuria, third trimester: Secondary | ICD-10-CM

## 2022-01-15 NOTE — Nursing Note (Signed)
C/o cramping, CTX pain 7-9/10, edema in feet     States she has been unable to walk well on her own due to the pain and has been having to waddle and have her husband help her get in and out of bed and the car x 1 week   States she does have some vision changes and has been seeing spots every now and then, no headaches    Also has said she is having severe burning in her c-section scar from past pregnancies    BP is elevated today 154/94, 144/93  will recheck    Had CMP, CBC, uric acid, and protein/creatinine  Here to be monitored and review labs    Patricia Kirchner, LPN

## 2022-01-16 ENCOUNTER — Encounter (INDEPENDENT_AMBULATORY_CARE_PROVIDER_SITE_OTHER): Payer: Self-pay | Admitting: OBSTETRICS/GYNECOLOGY

## 2022-01-16 DIAGNOSIS — O139 Gestational [pregnancy-induced] hypertension without significant proteinuria, unspecified trimester: Secondary | ICD-10-CM | POA: Insufficient documentation

## 2022-01-16 NOTE — Progress Notes (Signed)
OB/GYN, [place]  [address]  [place] [address]       RETURN OBSTETRICAL VISIT     Name: Patricia Santiago MRN:  [id number]   Date: 01/15/2022 Age: 31 y.o.     Patricia Santiago is a 31 y.o. [phone].   Nursing Notes:   Ernie Avena, LPN  23/76/28 3151  Signed  C/o cramping, CTX pain 7-9/10, edema in feet     States she has been unable to walk well on her own due to the pain and has been having to waddle and have her husband help her get in and out of bed and the car x 1 week   States she does have some vision changes and has been seeing spots every now and then, no headaches    Also has said she is having severe burning in her c-section scar from past pregnancies    BP is elevated today 154/94, 144/93  will recheck    Had CMP, CBC, uric acid, and protein/creatinine  Here to be monitored and review labs    Summer Price, LPN     Patient reports follow up OB      PE:  Weight: 66.5 kg (146 lb 8 oz)  BP (Non-Invasive): (!) 140/80  # of Fetuses: 1  Protein: Negative  Glucose: Negative  Ketones: Negative  Fundal Height: 36  Fetal Movement: Present  Edema: Negative  FHR (1): Noted on NST  Abdomen soft tenderness lower  Fundal Height:  36 cm  Vaginal/Cervical exam:  Closed long    Patient Active Problem List   Diagnosis    Supervision of high risk pregnancy in third trimester    History of cesarean delivery    Chronic active hepatitis C (CMS [place])    Threatened abortion in second trimester    Large for gestational age fetus affecting management of mother    False labor before 58 completed weeks of gestation in third trimester    Small for gestational age (SGA)       Data Reviewed:   Gestational age appropriate labs reviewed and [place] reviewed see procedure note  RMHOBLABS: OB Greater Than 36 Week Labs    Lab Results   Component Value Date    HGB 9.3 (L) 01/14/2022    HCT 29.2 (L) 01/14/2022    WBC 10.0 01/14/2022    PLTCNT 294 01/14/2022    ABORHD A POSITIVE 11/19/2021    ABSCR NEGATIVE 11/19/2021         Assessment     ICD-10-CM    1. Supervision of high risk pregnancy in third trimester  O09.93 POCT URINE DIPSTICK     59025 - FETAL NON STRESS TEST (AMB ONLY)      2. High-risk pregnancy in second trimester  O09.92 POCT URINE DIPSTICK     76160 - FETAL NON STRESS TEST (AMB ONLY)          Nonstress test was performed I have reviewed her her [place] labs normal  Plan:  Return in 4 days (on 01/19/2022) for OB  NST/US.     Conley Rolls, DO  01/16/2022 13:57       This note was partially generated using MModal Fluency Direct system, and there may be some incorrect words, spellings, and punctuation that were not noted in checking the note before saving.

## 2022-01-16 NOTE — Procedures (Signed)
OB/GYN, [place]  [address]  [place] [address]    Procedure Note    Name: Patricia Santiago MRN:  [id number]   Date: 01/15/2022 Age: 31 y.o.  DOB:   1990-05-24       16553 - FETAL NON STRESS TEST (AMB ONLY)    Performed by: Conley Rolls, DO  Authorized by: Conley Rolls, DO    Time Out:     Immediately before the procedure, a time out was called:  Yes    Patient verified:  Yes    Procedure Verified:  Yes    Site Verified:  Yes  Documentation:      Nonstress test with a baseline fetal heart 120s good beat-to-beat variability accelerations no decelerations category 1 fetal heart tracing no contractions noted      Conley Rolls, DO

## 2022-01-17 NOTE — Procedures (Signed)
OB/GYN, [place]  [address]  [place] [address]    Procedure Note    Name: SOPHEAP BASIC MRN:  [id number]   Date: 01/14/2022 Age: 31 y.o.  DOB:   1990-03-23       18403 - FETAL NON STRESS TEST (AMB ONLY)    Performed by: Conley Rolls, DO  Authorized by: Conley Rolls, DO    Time Out:     Immediately before the procedure, a time out was called:  Yes    Patient verified:  Yes    Procedure Verified:  Yes    Site Verified:  Yes  Documentation:      Nonstress test reveals a baseline fetal heart rate of 120s good beat-to-beat variability accelerations no pathologic decelerations category 1 fetal heart tracing no contractions no follow up in the office tomorrow      Conley Rolls, DO

## 2022-01-17 NOTE — Procedures (Signed)
OB/GYN, [place]  [address]   [place] [address]    Procedure Note    Name: Patricia Santiago MRN:  [id number]   Date: 01/14/2022 Age: 31 y.o.  DOB:   [birth year]       12820 - ULTRASOUND, PREGNANT UTERUS, REAL TIME W/ IMAGE DOCUMENTATION, LIMITED, 1 OR MORE FETUSES (AMB ONLY)    Performed by: Conley Rolls, DO  Authorized by: Conley Rolls, DO    Time Out:     Immediately before the procedure, a time out was called:  Yes    Patient verified:  Yes    Procedure Verified:  Yes    Site Verified:  Yes  Documentation:      Ultrasound  report with measurements reviewed. & signed. Results scanned to imaging order.   Interpretation:  Reviewed and discussed with the patient          Conley Rolls, DO

## 2022-01-17 NOTE — Progress Notes (Signed)
OB/GYN, [place]  [address]  [place] [address]       RETURN OBSTETRICAL VISIT     Name: Patricia Santiago MRN:  [id number]   Date: 01/14/2022 Age: 31 y.o.     Patricia Santiago is a 31 y.o. [phone].   Nursing Notes:   Ernie Avena, LPN  26/94/85 4627  Signed  Follow up OB  C/o cramping, CTX pain 7-9/10, edema in feet    States she has been unable to walk well on her own due to the pain and has been having to waddle and have her husband help her get in and out of bed and the car x 1 week    Also has said she is having severe burning in her c-section scar from past pregnancies    BP is elevated 162/92,  145/96    +FMC    Summer Price, LPN     Patient reports follow up      PE:  Weight: 64 kg (141 lb)  BP (Non-Invasive): (!) 145/96  # of Fetuses: 1  Protein: Negative  Glucose: Negative  Ketones: Negative  Fundal Height: 32  Fetal Movement: Present  Edema: 1+  FHR (1): 125  OB Exam Comments: CTX, cramping    Fundal Height:  32 cm  Vaginal/Cervical exam:  Defer    Patient Active Problem List   Diagnosis    High-risk pregnancy in second trimester    History of cesarean delivery    Chronic active hepatitis C (CMS [place])    Threatened abortion in second trimester    Large for gestational age fetus affecting management of mother    False labor before 44 completed weeks of gestation in third trimester    Small for gestational age (SGA)    Gestational hypertension       Data Reviewed:   Gestational age appropriate labs reviewed and [place] reviewed see procedure note       Assessment     ICD-10-CM    1. Supervision of high risk pregnancy in third trimester  O09.93 POCT URINE DIPSTICK     US FETAL TRANSABDOMINAL LIMITED 03500     93818 - ULTRASOUND, PREGNANT UTERUS, REAL TIME W/ IMAGE DOCUMENTATION, LIMITED, 1 OR MORE FETUSES (AMB ONLY)     59025 - FETAL NON STRESS TEST (AMB ONLY)     COMPREHENSIVE METABOLIC PANEL, NON-FASTING     PROTEIN/CREATININE RATIO, URINE, RANDOM     CBC/DIFF     URIC ACID     CANCELED:  URINALYSIS, MACROSCOPIC AND MICROSCOPIC W/CULTURE REFLEX      2. High-risk pregnancy in second trimester  O09.92 POCT URINE DIPSTICK     US FETAL TRANSABDOMINAL LIMITED 29937     16967 - ULTRASOUND, PREGNANT UTERUS, REAL TIME W/ IMAGE DOCUMENTATION, LIMITED, 1 OR MORE FETUSES (AMB ONLY)     59025 - FETAL NON STRESS TEST (AMB ONLY)     COMPREHENSIVE METABOLIC PANEL, NON-FASTING     PROTEIN/CREATININE RATIO, URINE, RANDOM     CBC/DIFF     URIC ACID     CANCELED: URINALYSIS, MACROSCOPIC AND MICROSCOPIC W/CULTURE REFLEX      3. False labor before 37 completed weeks of gestation in third trimester  O47.03 POCT URINE DIPSTICK     US FETAL TRANSABDOMINAL LIMITED 89381     01751 - ULTRASOUND, PREGNANT UTERUS, REAL TIME W/ IMAGE DOCUMENTATION, LIMITED, 1 OR MORE FETUSES (AMB ONLY)     02585 - FETAL NON STRESS TEST (AMB  ONLY)     COMPREHENSIVE METABOLIC PANEL, NON-FASTING     PROTEIN/CREATININE RATIO, URINE, RANDOM     CBC/DIFF     URIC ACID     CANCELED: URINALYSIS, MACROSCOPIC AND MICROSCOPIC W/CULTURE REFLEX        Nonstress test and limited obstetrical ultrasound performed described I have obtained [place] lab screening CBC urinalysis protein to creatinine ratio CMP follow up in the office tomorrow    Plan:  Nonstress test obstetrical ultrasound was performed as described follow up in the office tomorrow  No follow-ups on file.     Conley Rolls, DO  01/17/2022 12:13       This note was partially generated using MModal Fluency Direct system, and there may be some incorrect words, spellings, and punctuation that were not noted in checking the note before saving.

## 2022-01-18 ENCOUNTER — Encounter (HOSPITAL_COMMUNITY): Payer: Self-pay | Admitting: OBSTETRICS/GYNECOLOGY

## 2022-01-18 ENCOUNTER — Ambulatory Visit
Admission: RE | Admit: 2022-01-18 | Discharge: 2022-01-19 | Disposition: A | Discharge status: Home / Self Care | Payer: Medicaid Other | Attending: OBSTETRICS/GYNECOLOGY | Admitting: OBSTETRICS/GYNECOLOGY

## 2022-01-18 ENCOUNTER — Other Ambulatory Visit: Payer: Self-pay

## 2022-01-18 DIAGNOSIS — R252 Cramp and spasm: Secondary | ICD-10-CM | POA: Insufficient documentation

## 2022-01-18 DIAGNOSIS — O26893 Other specified pregnancy related conditions, third trimester: Secondary | ICD-10-CM | POA: Insufficient documentation

## 2022-01-18 DIAGNOSIS — Z3A37 37 weeks gestation of pregnancy: Secondary | ICD-10-CM | POA: Insufficient documentation

## 2022-01-19 ENCOUNTER — Encounter (INDEPENDENT_AMBULATORY_CARE_PROVIDER_SITE_OTHER): Payer: Medicaid Other | Admitting: OBSTETRICS/GYNECOLOGY

## 2022-01-19 DIAGNOSIS — Z0289 Encounter for other administrative examinations: Secondary | ICD-10-CM

## 2022-01-19 LAB — URINALYSIS, MICROSCOPIC
NON-SQUAMOUS EPITHELIAL CELLS URINE: <1 /hpf (ref <=1)
SQUAMOUS EPITHELIAL: 4 /hpf (ref <28)
WBCS: 1 /hpf (ref <6)

## 2022-01-19 LAB — URINALYSIS, MACROSCOPIC
BILIRUBIN: NEGATIVE mg/dL
BLOOD: NEGATIVE mg/dL
GLUCOSE: NEGATIVE mg/dL
KETONES: 10 mg/dL — AB
LEUKOCYTES: NEGATIVE WBCs/uL
NITRITE: NEGATIVE
PH: 6 (ref 5.0–9.0)
PROTEIN: 30 mg/dL — AB
SPECIFIC GRAVITY: 1.032 — ABNORMAL HIGH (ref 1.002–1.030)
UROBILINOGEN: 3 mg/dL — AB

## 2022-01-19 NOTE — Procedures (Signed)
Fetal Non Stress Test    Procedure Date:  01/19/2022  Procedure Time:  0045    Procedure: Fetal Non Stress Test  Indication:  cramping       [redacted]w[redacted]d  Patient's last menstrual period was 05/03/2021 (exact date).  Estimated Date of Delivery: 02/07/22    Fetal Heart:    Base Line: 130 bpm                         Decelerations:  None    Uterine Activity:  Contractions: No    Interpretation: Reactive    NST read by Ileene Musa, RN Beckie Busing RN    Ileene Musa, RN 01/19/2022 00:56    Reviewed and agree

## 2022-01-19 NOTE — Nurses Notes (Signed)
Call placed to provider to inform of patient's complaints, BP readings, UA results, NST and SVE. Order received to DC home and follow up in office this week on next scheduled appointment.

## 2022-01-22 ENCOUNTER — Encounter (INDEPENDENT_AMBULATORY_CARE_PROVIDER_SITE_OTHER): Payer: Self-pay | Admitting: OBSTETRICS/GYNECOLOGY

## 2022-01-26 ENCOUNTER — Encounter (INDEPENDENT_AMBULATORY_CARE_PROVIDER_SITE_OTHER): Payer: Self-pay | Admitting: OBSTETRICS/GYNECOLOGY

## 2022-01-26 ENCOUNTER — Ambulatory Visit (INDEPENDENT_AMBULATORY_CARE_PROVIDER_SITE_OTHER): Payer: Medicaid Other | Admitting: OBSTETRICS/GYNECOLOGY

## 2022-01-26 ENCOUNTER — Other Ambulatory Visit (INDEPENDENT_AMBULATORY_CARE_PROVIDER_SITE_OTHER): Payer: Medicaid Other

## 2022-01-26 ENCOUNTER — Other Ambulatory Visit: Payer: Self-pay

## 2022-01-26 VITALS — BP 134/86 | Wt 149.2 lb

## 2022-01-26 DIAGNOSIS — R252 Cramp and spasm: Secondary | ICD-10-CM

## 2022-01-26 DIAGNOSIS — O0993 Supervision of high risk pregnancy, unspecified, third trimester: Secondary | ICD-10-CM

## 2022-01-26 DIAGNOSIS — O26893 Other specified pregnancy related conditions, third trimester: Secondary | ICD-10-CM

## 2022-01-26 NOTE — Procedures (Signed)
[facility], [facility]  [address] [place] [address]    Procedure Note    Name: Patricia Santiago MRN:  [id number]   Date: 01/07/2022 Age: 31 y.o.  DOB:   1991/02/13       [place] PREGNANT UTERUS, FETAL & MATERNAL, AFTER 1ST TRIMESTER, TRANSABDOMINAL; UP TO 5 GESTATIONS (AMB ONLY)    Performed by: Remonia Richter, DO  Authorized by: Remonia Richter, DO    Time Out:      Immediately before the procedure, a time out was called:  Yes      Patient verified:  Yes      Procedure verified:  Yes      Site verified:  Yes  Procedure Details:      Number of gestations:  1       Documentation:  Ultrasound  report with measurements reviewed. & signed. Results scanned to imaging order.   Interpretation:  Reviewed and discussed with the patient          Remonia Richter, DO

## 2022-01-26 NOTE — Progress Notes (Signed)
OB[facility], [facility]  [address]  [place] [address]       RETURN OBSTETRICAL VISIT     Name: Patricia Santiago MRN:  [id number]   Date: 01/07/2022 Age: 31 y.o.     Patricia Santiago is a 31 y.o. [phone].   Nursing Notes:   Clovis Fredrickson, LPN  19/41/74 0814  Signed  C/o cramping, pressure,tightening  CTX 5-7 min apart on Sunday    +FMC    Summer Price, LPN     Patient reports follow up      PE:  Weight: 65.3 kg (144 lb)  BP (Non-Invasive): 138/80  # of Fetuses: 1  Protein: Negative  Glucose: Negative  Ketones: Negative  Fundal Height: 34  Fetal Movement: Present  Presentation: Cephalic  Edema: Negative  FHR (1): 148  OB Exam Comments: cramping pressure tightening CTX    Fundal Height:  32 cm  Vaginal/Cervical exam:  Closed long    Patient Active Problem List   Diagnosis    High-risk pregnancy in second trimester    History of cesarean delivery    Chronic active hepatitis C (CMS HCC)    Threatened abortion in second trimester    Large for gestational age fetus affecting management of mother    False labor before 29 completed weeks of gestation in third trimester    Small for gestational age (SGA)    Gestational hypertension       Data Reviewed:   Gestational age appropriate labs reviewed and [place] reviewed see procedure note  RMHOBLABS: OB 32-36 Week Labs    Lab Results   Component Value Date    HGB 9.3 (L) 01/14/2022    HCT 29.2 (L) 01/14/2022    ABORHD A POSITIVE 11/19/2021    ABSCR NEGATIVE 11/19/2021    GROUPBPCR Negative 01/07/2022        Assessment     ICD-10-CM    1. Supervision of high risk pregnancy in third trimester  O09.93 GROUP B STREPTOCOCCUS DNA BY NAAT     US FETAL TRANSABD 2ND/3RD TRI>14 WKS [address] W EA ADD [phone]     [place] PREGNANT UTERUS, FETAL & MATERNAL, AFTER 1ST TRIMESTER, TRANSABDOMINAL; UP TO 5 GESTATIONS (AMB ONLY)     CANCELED: GROUP B STREPTOCOCCUS DNA BY NAAT      2. False labor before 37 completed weeks of gestation in third trimester  O47.03 GROUP B STREPTOCOCCUS DNA BY NAAT      US FETAL TRANSABD 2ND/3RD TRI>14 WKS 70263 W EA ADD [phone]     [place] PREGNANT UTERUS, FETAL & MATERNAL, AFTER 1ST TRIMESTER, TRANSABDOMINAL; UP TO 5 GESTATIONS (AMB ONLY)     CANCELED: GROUP B STREPTOCOCCUS DNA BY NAAT          Nonstress test performed as described complete obstetrical performed as described  Plan:  Precautions given cautions given follow up 1  No follow-ups on file.     Patricia Richter, DO  01/26/2022 10:35       This note was partially generated using MModal Fluency Direct system, and there may be some incorrect words, spellings, and punctuation that were not noted in checking the note before saving.

## 2022-01-26 NOTE — Nursing Note (Signed)
C/o cramping, tightening, pressure.  Similar to CTX like pain, says it sometimes wakes her out of her sleep      +FMC    Ernie Avena, LPN

## 2022-01-26 NOTE — Procedures (Signed)
[facility], [facility]  [address]  [place] [address]    Procedure Note    Name: Patricia Santiago MRN:  [id number]   Date: 01/07/2022 Age: 31 y.o.  DOB:   1991-01-09       03888 - FETAL NON STRESS TEST (AMB ONLY)    Performed by: Remonia Richter, DO  Authorized by: Remonia Richter, DO    Time Out:     Immediately before the procedure, a time out was called:  Yes    Patient verified:  Yes    Procedure Verified:  Yes    Site Verified:  Yes  Documentation:      Nonstress test baseline fetal heart 120s good beat-to-beat variability accelerations lots of uterine irritability some mild contractions every 2-3 minutes      Remonia Richter, DO

## 2022-01-29 ENCOUNTER — Other Ambulatory Visit: Payer: Self-pay

## 2022-01-29 ENCOUNTER — Ambulatory Visit (INDEPENDENT_AMBULATORY_CARE_PROVIDER_SITE_OTHER): Payer: Medicaid Other | Admitting: OBSTETRICS/GYNECOLOGY

## 2022-01-29 ENCOUNTER — Encounter (INDEPENDENT_AMBULATORY_CARE_PROVIDER_SITE_OTHER): Payer: Self-pay | Admitting: OBSTETRICS/GYNECOLOGY

## 2022-01-29 VITALS — BP 149/84 | Wt 150.5 lb

## 2022-01-29 DIAGNOSIS — O471 False labor at or after 37 completed weeks of gestation: Secondary | ICD-10-CM

## 2022-01-29 DIAGNOSIS — O0993 Supervision of high risk pregnancy, unspecified, third trimester: Secondary | ICD-10-CM

## 2022-01-29 DIAGNOSIS — R600 Localized edema: Secondary | ICD-10-CM

## 2022-01-29 DIAGNOSIS — O26893 Other specified pregnancy related conditions, third trimester: Secondary | ICD-10-CM

## 2022-01-29 NOTE — Nursing Note (Signed)
Follow up OB    C/o ctx pain, cramping, pressure for the past 2-3 weeks. Will wake her up from her sleep. Some edema in her feet.    C-section for 11/20    She wants to have a tubal after her delivery.    +FMC    Ernie Avena, LPN

## 2022-01-30 ENCOUNTER — Encounter (HOSPITAL_COMMUNITY): Payer: Self-pay | Admitting: OBSTETRICS/GYNECOLOGY

## 2022-01-30 ENCOUNTER — Other Ambulatory Visit: Payer: Self-pay

## 2022-01-30 ENCOUNTER — Ambulatory Visit (HOSPITAL_BASED_OUTPATIENT_CLINIC_OR_DEPARTMENT_OTHER)
Admission: EM | Admit: 2022-01-30 | Discharge: 2022-01-30 | Disposition: A | Discharge status: Home / Self Care | Payer: Medicaid Other | Source: Ambulatory Visit | Attending: OBSTETRICS/GYNECOLOGY | Admitting: OBSTETRICS/GYNECOLOGY

## 2022-01-30 DIAGNOSIS — O471 False labor at or after 37 completed weeks of gestation: Secondary | ICD-10-CM | POA: Insufficient documentation

## 2022-01-30 DIAGNOSIS — Z3A38 38 weeks gestation of pregnancy: Secondary | ICD-10-CM | POA: Insufficient documentation

## 2022-01-30 LAB — URINALYSIS, MACROSCOPIC
BILIRUBIN: NEGATIVE mg/dL
BLOOD: NEGATIVE mg/dL
GLUCOSE: NEGATIVE mg/dL
KETONES: NEGATIVE mg/dL
LEUKOCYTES: NEGATIVE WBCs/uL
NITRITE: NEGATIVE
PH: 6 (ref 5.0–9.0)
PROTEIN: NEGATIVE mg/dL
SPECIFIC GRAVITY: 1.014 (ref 1.002–1.030)
UROBILINOGEN: NORMAL mg/dL

## 2022-01-30 LAB — URINALYSIS, MICROSCOPIC
BACTERIA: NEGATIVE /hpf
RBCS: <1 /hpf (ref <4)
SQUAMOUS EPITHELIAL: 5 /hpf (ref <28)
WBCS: 1 /hpf (ref <6)

## 2022-01-30 MED ORDER — LACTATED RINGERS IV BOLUS
1000.0000 mL | INJECTION | Freq: Once | Status: AC
Start: 2022-01-30 — End: 2022-01-30
  Administered 2022-01-30 (×2): 0 mL via INTRAVENOUS
  Administered 2022-01-30 (×2): 1000 mL via INTRAVENOUS

## 2022-01-30 MED ORDER — LACTATED RINGERS IV BOLUS
1000.0000 mL | INJECTION | Freq: Once | Status: AC
Start: 2022-01-30 — End: 2022-01-30
  Administered 2022-01-30: 1000 mL via INTRAVENOUS

## 2022-01-30 NOTE — Nurses Notes (Signed)
Pt has had no cervical change, Pt to be discharged home and to return Monday for scheduled C/Section.Pt given labor precautions and when to return if needed.

## 2022-01-30 NOTE — Procedures (Signed)
Fetal Non Stress Test    Procedure Date:  01/30/2022  Procedure Time: 1300  Procedure: Fetal Non Stress Test  Indication:  Contractions        [redacted]w[redacted]d  Patient's last menstrual period was 05/03/2021 (exact date).  Estimated Date of Delivery: 02/07/22    Fetal Heart:    Base Line: 130 bpm                         Decelerations:  None    Uterine Activity:  Contractions: Yes    Interpretation: Reactive    NST read by Jacki Cones, RN  [place], RN 01/30/2022 16:30

## 2022-01-31 ENCOUNTER — Encounter (HOSPITAL_COMMUNITY)
Admission: RE | Disposition: A | Discharge status: Home / Self Care | Payer: Self-pay | Source: Home / Self Care | Attending: OBSTETRICS/GYNECOLOGY

## 2022-01-31 ENCOUNTER — Inpatient Hospital Stay
Admission: RE | Admit: 2022-01-31 | Discharge: 2022-02-02 | DRG: 784 | Disposition: A | Discharge status: Home / Self Care | Payer: Medicaid Other | Attending: OBSTETRICS/GYNECOLOGY | Admitting: OBSTETRICS/GYNECOLOGY

## 2022-01-31 ENCOUNTER — Encounter (HOSPITAL_COMMUNITY): Payer: Self-pay | Admitting: OBSTETRICS/GYNECOLOGY

## 2022-01-31 ENCOUNTER — Inpatient Hospital Stay (HOSPITAL_COMMUNITY): Payer: Medicaid Other | Admitting: Anesthesiology

## 2022-01-31 ENCOUNTER — Other Ambulatory Visit: Payer: Self-pay

## 2022-01-31 DIAGNOSIS — B182 Chronic viral hepatitis C: Secondary | ICD-10-CM | POA: Diagnosis present

## 2022-01-31 DIAGNOSIS — O4202 Full-term premature rupture of membranes, onset of labor within 24 hours of rupture: Secondary | ICD-10-CM

## 2022-01-31 DIAGNOSIS — Z3A39 39 weeks gestation of pregnancy: Secondary | ICD-10-CM

## 2022-01-31 DIAGNOSIS — O34211 Maternal care for low transverse scar from previous cesarean delivery: Principal | ICD-10-CM | POA: Diagnosis present

## 2022-01-31 DIAGNOSIS — Z8619 Personal history of other infectious and parasitic diseases: Secondary | ICD-10-CM

## 2022-01-31 DIAGNOSIS — Z302 Encounter for sterilization: Secondary | ICD-10-CM

## 2022-01-31 DIAGNOSIS — N858 Other specified noninflammatory disorders of uterus: Secondary | ICD-10-CM | POA: Diagnosis present

## 2022-01-31 DIAGNOSIS — O9081 Anemia of the puerperium: Secondary | ICD-10-CM | POA: Diagnosis not present

## 2022-01-31 DIAGNOSIS — O9842 Viral hepatitis complicating childbirth: Secondary | ICD-10-CM | POA: Diagnosis present

## 2022-01-31 LAB — URINALYSIS, MACROSCOPIC
BILIRUBIN: NEGATIVE mg/dL
BLOOD: NEGATIVE mg/dL
GLUCOSE: NEGATIVE mg/dL
KETONES: 10 mg/dL — AB
LEUKOCYTES: 25 WBCs/uL — AB
NITRITE: NEGATIVE
PH: 6 (ref 5.0–9.0)
PROTEIN: 100 mg/dL — AB
SPECIFIC GRAVITY: 1.015 (ref 1.002–1.030)
UROBILINOGEN: 2 mg/dL — AB

## 2022-01-31 LAB — TYPE AND SCREEN
ABO/RH(D): A POS
ANTIBODY SCREEN: NEGATIVE

## 2022-01-31 LAB — URINE DRUG SCREEN
AMPHET QL: NEGATIVE
BARB QL: NEGATIVE
BENZO QL: NEGATIVE
BUP QL: NEGATIVE
CANNAQL: NEGATIVE
COCQL: NEGATIVE
FENTANYL, RANDOM URINE: NEGATIVE
METHQL: NEGATIVE
OPIATE: NEGATIVE
OXYCODONE URINE: NEGATIVE
PCP QL: NEGATIVE

## 2022-01-31 LAB — CBC
HCT: 28.3 % — ABNORMAL LOW (ref 31.2–41.9)
HGB: 9.2 g/dL — ABNORMAL LOW (ref 10.9–14.3)
MCH: 23.4 pg — ABNORMAL LOW (ref 24.7–32.8)
MCHC: 32.3 g/dL (ref 32.3–35.6)
MCV: 72.5 fL — ABNORMAL LOW (ref 75.5–95.3)
MPV: 8.3 fL (ref 7.9–10.8)
PLATELETS: 295 10*3/uL (ref 140–440)
RBC: 3.91 10*6/uL (ref 3.63–4.92)
RDW: 18.2 % — ABNORMAL HIGH (ref 12.3–17.7)
WBC: 10.3 10*3/uL (ref 3.8–11.8)

## 2022-01-31 LAB — URINALYSIS, MICROSCOPIC
RBCS: 1 /hpf (ref <4)
SQUAMOUS EPITHELIAL: 3 /hpf (ref <28)
WBCS: 4 /hpf (ref <6)

## 2022-01-31 LAB — ACTIM PROM: ACTIM PROM: POSITIVE

## 2022-01-31 SURGERY — Surgical Case
Anesthesia: Spinal | Site: Abdomen | Wound class: Clean Wound: Uninfected operative wounds in which no inflammation occurred

## 2022-01-31 MED ORDER — SODIUM CHLORIDE 0.9 % (FLUSH) INJECTION SYRINGE
3.0000 mL | INJECTION | Freq: Three times a day (TID) | INTRAMUSCULAR | Status: DC
Start: 2022-01-31 — End: 2022-02-02
  Administered 2022-02-01 – 2022-02-02 (×3): 0 mL

## 2022-01-31 MED ORDER — LACTATED RINGERS INTRAVENOUS SOLUTION
INTRAVENOUS | Status: AC
Start: 2022-01-31 — End: 2022-01-31

## 2022-01-31 MED ORDER — ONDANSETRON 4 MG DISINTEGRATING TABLET
4.0000 mg | ORAL_TABLET | Freq: Four times a day (QID) | ORAL | Status: AC | PRN
Start: 2022-01-31 — End: 2022-02-01

## 2022-01-31 MED ORDER — ZOLPIDEM 5 MG TABLET
5.0000 mg | ORAL_TABLET | Freq: Every evening | ORAL | Status: DC | PRN
Start: 2022-01-31 — End: 2022-02-02

## 2022-01-31 MED ORDER — FENTANYL (PF) 50 MCG/ML INJECTION WRAPPER
25.0000 ug | INJECTION | INTRAMUSCULAR | Status: DC | PRN
Start: 2022-01-31 — End: 2022-02-02

## 2022-01-31 MED ORDER — MORPHINE 4 MG/ML INJECTION WRAPPER
4.0000 mg | INJECTION | INTRAMUSCULAR | Status: DC | PRN
Start: 2022-01-31 — End: 2022-02-02

## 2022-01-31 MED ORDER — FENTANYL (PF) 50 MCG/ML INJECTION WRAPPER
50.0000 ug | INJECTION | INTRAMUSCULAR | Status: DC | PRN
Start: 2022-01-31 — End: 2022-02-02

## 2022-01-31 MED ORDER — PHENYLEPHRINE 10 MG/ML INJECTION SOLUTION
Freq: Once | INTRAMUSCULAR | Status: DC | PRN
Start: 2022-01-31 — End: 2022-01-31
  Administered 2022-01-31 (×2): 100 ug via INTRAVENOUS

## 2022-01-31 MED ORDER — FENTANYL (PF) 50 MCG/ML INJECTION WRAPPER
INJECTION | Freq: Once | INTRAMUSCULAR | Status: AC | PRN
Start: 2022-01-31 — End: 2022-01-31
  Administered 2022-01-31: 25 ug via INTRATHECAL

## 2022-01-31 MED ORDER — HYDROCODONE 7.5 MG-ACETAMINOPHEN 325 MG TABLET
1.0000 | ORAL_TABLET | ORAL | Status: DC | PRN
Start: 2022-01-31 — End: 2022-02-02
  Administered 2022-01-31 – 2022-02-02 (×6): 1 via ORAL
  Filled 2022-01-31 (×7): qty 1

## 2022-01-31 MED ORDER — METHYLERGONOVINE 0.2 MG/ML (1 ML) INJECTION SOLUTION
Freq: Once | INTRAMUSCULAR | Status: DC | PRN
Start: 2022-01-31 — End: 2022-01-31
  Administered 2022-01-31: .2 mg via INTRAMUSCULAR

## 2022-01-31 MED ORDER — OXYTOCIN 10 UNIT/ML INJECTION SOLUTION
INTRAMUSCULAR | Status: AC
Start: 2022-01-31 — End: 2022-02-01
  Filled 2022-01-31: qty 3

## 2022-01-31 MED ORDER — FENTANYL (PF) 50 MCG/ML INJECTION SOLUTION
INTRAMUSCULAR | Status: AC
Start: 2022-01-31 — End: 2022-01-31
  Filled 2022-01-31: qty 2

## 2022-01-31 MED ORDER — OXYTOCIN 10 UNIT/ML INJECTION SOLUTION
INTRAVENOUS | Status: AC
Start: 2022-01-31 — End: 2022-01-31
  Filled 2022-01-31: qty 3

## 2022-01-31 MED ORDER — HYDROXYZINE PAMOATE 50 MG CAPSULE
100.0000 mg | ORAL_CAPSULE | Freq: Every evening | ORAL | Status: DC | PRN
Start: 2022-01-31 — End: 2022-02-02

## 2022-01-31 MED ORDER — LACTATED RINGERS INTRAVENOUS SOLUTION
INTRAVENOUS | Status: DC | PRN
Start: 2022-01-31 — End: 2022-01-31
  Administered 2022-01-31: 375 m[IU]/min via INTRAVENOUS

## 2022-01-31 MED ORDER — METHYLERGONOVINE 0.2 MG/ML (1 ML) INJECTION SOLUTION
INTRAMUSCULAR | Status: AC
Start: 2022-01-31 — End: 2022-01-31
  Filled 2022-01-31: qty 1

## 2022-01-31 MED ORDER — CEFAZOLIN 1 GRAM SOLUTION FOR INJECTION
Freq: Once | INTRAMUSCULAR | Status: DC | PRN
Start: 2022-01-31 — End: 2022-01-31
  Administered 2022-01-31: 1000 mg via INTRAVENOUS

## 2022-01-31 MED ORDER — LACTATED RINGERS INTRAVENOUS SOLUTION
INTRAVENOUS | Status: DC
Start: 2022-01-31 — End: 2022-02-02

## 2022-01-31 MED ORDER — SODIUM CHLORIDE 0.9 % (FLUSH) INJECTION SYRINGE
3.0000 mL | INJECTION | Freq: Three times a day (TID) | INTRAMUSCULAR | Status: DC
Start: 2022-01-31 — End: 2022-02-02
  Administered 2022-01-31 (×2): 3 mL
  Administered 2022-02-01 – 2022-02-02 (×2): 0 mL

## 2022-01-31 MED ORDER — IPRATROPIUM 0.5 MG-ALBUTEROL 3 MG (2.5 MG BASE)/3 ML NEBULIZATION SOLN
3.0000 mL | INHALATION_SOLUTION | Freq: Once | RESPIRATORY_TRACT | Status: DC | PRN
Start: 2022-01-31 — End: 2022-02-02

## 2022-01-31 MED ORDER — ONDANSETRON HCL (PF) 4 MG/2 ML INJECTION SOLUTION
4.0000 mg | Freq: Four times a day (QID) | INTRAMUSCULAR | Status: AC | PRN
Start: 2022-01-31 — End: 2022-02-01

## 2022-01-31 MED ORDER — SODIUM CITRATE-CITRIC ACID 490 MG-640 MG/5 ML ORAL SOLUTION
ORAL | Status: AC
Start: 2022-01-31 — End: 2022-02-01
  Filled 2022-01-31: qty 30

## 2022-01-31 MED ORDER — MIDAZOLAM 5 MG/ML INJECTION WRAPPER
INTRAMUSCULAR | Status: AC
Start: 2022-01-31 — End: 2022-01-31
  Filled 2022-01-31: qty 1

## 2022-01-31 MED ORDER — SODIUM CHLORIDE 0.9 % INTRAVENOUS PIGGYBACK
1.0000 g | Freq: Three times a day (TID) | INTRAVENOUS | Status: AC
Start: 2022-01-31 — End: 2022-02-01
  Administered 2022-01-31: 1 g via INTRAVENOUS
  Administered 2022-01-31: 0 g via INTRAVENOUS
  Administered 2022-01-31: 1 g via INTRAVENOUS
  Administered 2022-01-31: 0 g via INTRAVENOUS
  Administered 2022-02-01: 1 g via INTRAVENOUS
  Filled 2022-01-31 (×3): qty 10

## 2022-01-31 MED ORDER — KETAMINE 100 MG/ML INJECTION SOLUTION
Freq: Once | INTRAMUSCULAR | Status: DC | PRN
Start: 2022-01-31 — End: 2022-01-31
  Administered 2022-01-31: 50 mg via INTRAVENOUS

## 2022-01-31 MED ORDER — BUPIVACAINE (PF) 0.75 % (7.5 MG/ML) IN 8.25 % DEXTROSE INJECTION
Freq: Once | INTRAMUSCULAR | Status: AC | PRN
Start: 2022-01-31 — End: 2022-01-31
  Administered 2022-01-31: 1.2 mL via INTRATHECAL

## 2022-01-31 MED ORDER — SODIUM CHLORIDE 0.9 % (FLUSH) INJECTION SYRINGE
3.0000 mL | INJECTION | INTRAMUSCULAR | Status: DC | PRN
Start: 2022-01-31 — End: 2022-02-02

## 2022-01-31 MED ORDER — KETAMINE 100 MG/ML INJECTION SOLUTION
INTRAMUSCULAR | Status: AC
Start: 2022-01-31 — End: 2022-01-31
  Filled 2022-01-31: qty 5

## 2022-01-31 MED ORDER — KETOROLAC 30 MG/ML (1 ML) INJECTION SOLUTION
Freq: Once | INTRAMUSCULAR | Status: DC | PRN
Start: 2022-01-31 — End: 2022-01-31
  Administered 2022-01-31: 30 mg via INTRAVENOUS

## 2022-01-31 MED ORDER — SIMETHICONE 80 MG CHEWABLE TABLET
80.0000 mg | CHEWABLE_TABLET | ORAL | Status: DC
Start: 2022-01-31 — End: 2022-02-02
  Administered 2022-01-31 – 2022-02-02 (×8): 80 mg via ORAL
  Administered 2022-02-02: 0 mg via ORAL
  Filled 2022-01-31 (×9): qty 1

## 2022-01-31 MED ORDER — IBUPROFEN 800 MG TABLET
800.0000 mg | ORAL_TABLET | Freq: Three times a day (TID) | ORAL | Status: DC | PRN
Start: 2022-02-01 — End: 2022-02-02
  Administered 2022-02-01 – 2022-02-02 (×2): 800 mg via ORAL
  Filled 2022-01-31 (×2): qty 1

## 2022-01-31 MED ORDER — ONDANSETRON HCL (PF) 4 MG/2 ML INJECTION SOLUTION
Freq: Once | INTRAMUSCULAR | Status: DC | PRN
Start: 2022-01-31 — End: 2022-01-31
  Administered 2022-01-31: 4 mg via INTRAVENOUS

## 2022-01-31 MED ORDER — PHENYLEPHRINE 10 MG/ML INJECTION SOLUTION
Freq: Once | INTRAMUSCULAR | Status: DC | PRN
Start: 2022-01-31 — End: 2022-01-31
  Administered 2022-01-31: 150 ug via INTRAVENOUS
  Administered 2022-01-31 (×3): 100 ug via INTRAVENOUS

## 2022-01-31 MED ORDER — MIDAZOLAM 5 MG/ML INJECTION WRAPPER
Freq: Once | INTRAMUSCULAR | Status: DC | PRN
Start: 2022-01-31 — End: 2022-01-31
  Administered 2022-01-31 (×2): 5 mg via INTRAVENOUS

## 2022-01-31 MED ORDER — ACETAMINOPHEN 325 MG TABLET
650.0000 mg | ORAL_TABLET | Freq: Four times a day (QID) | ORAL | Status: DC | PRN
Start: 2022-01-31 — End: 2022-02-02
  Administered 2022-01-31 – 2022-02-01 (×2): 650 mg via ORAL
  Filled 2022-01-31 (×3): qty 2

## 2022-01-31 MED ORDER — LACTATED RINGERS INTRAVENOUS SOLUTION
INTRAVENOUS | Status: AC
Start: 2022-01-31 — End: ?

## 2022-01-31 MED ORDER — OXYTOCIN 10 UNIT/ML INJECTION SOLUTION
10.0000 [IU] | Freq: Once | INTRAMUSCULAR | Status: DC
Start: 2022-01-31 — End: 2022-02-02
  Administered 2022-01-31: 0 [IU] via INTRAMUSCULAR

## 2022-01-31 MED ORDER — ONDANSETRON HCL (PF) 4 MG/2 ML INJECTION SOLUTION
4.0000 mg | Freq: Once | INTRAMUSCULAR | Status: DC | PRN
Start: 2022-01-31 — End: 2022-02-02

## 2022-01-31 MED ORDER — CEFAZOLIN 1 GRAM SOLUTION FOR INJECTION
INTRAMUSCULAR | Status: AC
Start: 2022-01-31 — End: 2022-01-31
  Filled 2022-01-31: qty 20

## 2022-01-31 MED ORDER — MEPERIDINE (PF) 25 MG/ML INJECTION SOLUTION
50.0000 mg | INTRAMUSCULAR | Status: DC | PRN
Start: 2022-01-31 — End: 2022-02-02
  Administered 2022-01-31 – 2022-02-01 (×3): 50 mg via INTRAVENOUS
  Filled 2022-01-31 (×4): qty 2

## 2022-01-31 MED ORDER — ALBUTEROL SULFATE 2.5 MG/3 ML (0.083 %) SOLUTION FOR NEBULIZATION
2.5000 mg | INHALATION_SOLUTION | Freq: Once | RESPIRATORY_TRACT | Status: DC | PRN
Start: 2022-01-31 — End: 2022-02-02

## 2022-01-31 MED ORDER — DOCUSATE SODIUM 100 MG CAPSULE
100.0000 mg | ORAL_CAPSULE | Freq: Two times a day (BID) | ORAL | Status: DC
Start: 2022-01-31 — End: 2022-02-02
  Administered 2022-01-31 – 2022-02-01 (×3): 100 mg via ORAL
  Filled 2022-01-31 (×4): qty 1

## 2022-01-31 MED ORDER — BUTORPHANOL 2 MG/ML INJECTION SOLUTION
0.5000 mg | Freq: Once | INTRAMUSCULAR | Status: AC
Start: 2022-01-31 — End: 2022-01-31
  Administered 2022-01-31: 0.5 mg via INTRAVENOUS
  Filled 2022-01-31: qty 1

## 2022-01-31 MED ORDER — KETOROLAC 30 MG/ML (1 ML) INJECTION SOLUTION
30.0000 mg | Freq: Once | INTRAMUSCULAR | Status: AC
Start: 2022-01-31 — End: 2022-02-01
  Administered 2022-01-31: 0 mg via INTRAVENOUS
  Administered 2022-02-01: 15 mg via INTRAVENOUS
  Filled 2022-01-31: qty 1

## 2022-01-31 MED ORDER — KETOROLAC 30 MG/ML (1 ML) INJECTION SOLUTION
15.0000 mg | Freq: Three times a day (TID) | INTRAMUSCULAR | Status: AC
Start: 2022-01-31 — End: 2022-02-01
  Administered 2022-01-31 – 2022-02-01 (×2): 15 mg via INTRAVENOUS
  Filled 2022-01-31 (×2): qty 1

## 2022-01-31 MED ORDER — PROCHLORPERAZINE EDISYLATE 10 MG/2 ML (5 MG/ML) INJECTION SOLUTION
5.0000 mg | Freq: Once | INTRAMUSCULAR | Status: DC | PRN
Start: 2022-01-31 — End: 2022-02-02

## 2022-01-31 MED ORDER — HYDROCODONE 7.5 MG-ACETAMINOPHEN 325 MG/15 ML ORAL SOLUTION
5.0000 mg | ORAL | Status: DC | PRN
Start: 2022-01-31 — End: 2022-01-31

## 2022-01-31 SURGICAL SUPPLY — 8 items
ADH SKNCLS 2-OCTYL CYNCRLT DRMBND PRINEO LIQUID 22CM (SUTURE/WOUND CLOSURE) ×1 IMPLANT
CONV USE ITEM 112329 - ADH SKNCLS 2-OCTYL CYNCRLT DRMBND PRINEO LIQUID 22CM (SUTURE/WOUND CLOSURE) ×1
CONV USE ITEM 34153 - ELECTRODE ESURG BLADE PNCL 3/32IN STRL SS CAUT PSHBTN STD SHAFT LF  VEGA SER (SURGICAL CUTTING SUPPLIES) ×1 IMPLANT
ELECTRODE ESURG BLADE PNCL 3/32IN STRL SS CAUT PSHBTN STD (CUTTING ELEMENTS) ×1
MATTRESS TRANSF 34IN HOVERMATT BRTHBL NONST LF  DISP (MED SURG SUPPLIES) ×1 IMPLANT
MATTRESS TRANSF 34IN HOVERMATT_BRTHBL NONST LF DISP (MED SURG SUPPLIES) ×1
TRAY SURG CSECT LF (CUSTOM TRAYS & PACK) ×1 IMPLANT
TRAY SURGICAL C SECTION LATEX FREE (CUSTOM TRAYS & PACK) ×1

## 2022-01-31 NOTE — Anesthesia Preprocedure Evaluation (Addendum)
ANESTHESIA PRE-OP EVALUATION  Planned Procedure: REPEAT CESAREAN SECTION (Abdomen)  Review of Systems                   Pulmonary     Cardiovascular    History of pregnancy induced hypertension ,       GI/Hepatic/Renal           Endo/Other         Neuro/Psych/MS        Cancer                        Physical Assessment      Airway       Mallampati: II                  Dental                    Pulmonary           Cardiovascular             Other findings              Plan  ASA 2     Planned anesthesia type: spinal

## 2022-01-31 NOTE — Care Plan (Signed)
Will continue to support patient in her request for limited pain med interventions, and will continue to monitor progress after cesarean section surgery.

## 2022-01-31 NOTE — Anesthesia Transfer of Care (Signed)
ANESTHESIA TRANSFER OF CARE   Patricia Santiago is a 31 y.o. ,female, Weight: 68.5 kg (151 lb)   had Procedure(s):  REPEAT CESAREAN SECTION  performed  01/31/22   Primary Service: Remonia Richter, DO    Past Medical History:   Diagnosis Date   . Endometriosis    . Known health problems: none       Allergy History as of 01/31/22       CLINDAMYCIN         Noted Status Severity Type Reaction    06/02/21 1430 Rosine Door, RN 06/02/21 Active                 ERYTHROMYCIN         Noted Status Severity Type Reaction    07/29/21 1423 [place], [place], [place] 06/02/21 Deleted       06/02/21 1430 Rosine Door, RN 06/02/21 Active                 CLINDAMYCIN         Noted Status Severity Type Reaction    06/16/21 0843 Carles Collet 06/07/21 Deleted       06/07/21 1515 Chevis Pretty, RN 06/07/21 Active                 ERYTHROMYCIN         Noted Status Severity Type Reaction    06/07/21 1516 Chevis Pretty, RN 06/07/21 Active Low  Nausea/ Vomiting                  I completed my transfer of care / handoff to the receiving personnel during which we discussed:  Access, Airway, All key/critical aspects of case discussed, Analgesia, Antibiotics, Expectation of post procedure, Fluids/Product, Gave opportunity for questions and acknowledgement of understanding, Labs and PMHx  Report given to: Sharol Harness, RN    Post Location: L&D room                                                           Last OR Temp: Temperature: 36.1 C (97 F)  ABG:  POTASSIUM   Date Value Ref Range Status   01/14/2022 3.7 3.5 - 5.1 mmol/L Final     KETONES   Date Value Ref Range Status   01/31/2022 10 (A) Negative, Trace mg/dL Final     CALCIUM   Date Value Ref Range Status   01/14/2022 8.5 (L) 8.6 - 10.3 mg/dL Final     Airway:* No LDAs found *  Blood pressure (!) 168/71, pulse 82, temperature 36.1 C (97 F), resp. rate 20, height 1.549 m (5\' 1" ), weight 68.5 kg (151 lb), last menstrual period 05/03/2021, SpO2 100%, not currently breastfeeding.

## 2022-01-31 NOTE — Anesthesia Procedure Notes (Signed)
Sherryll Burger    Neuraxial Block    Performed by:   Performing Provider: Fannie Knee, CRNA   Authorizing provider: Delle Reining, MD    Sedation        Blocks   Block: spinal   Type of block: single shot            Technique:            Preprocedure hand washing was performed sterile field maintained   Needle level: L3-4     Preanesthesia Checklist:  Pre anesthesia checklist: site marked, surgical consent, monitors and equipment checked, timeout performed, anesthesia consent, emergency drugs available and Pateint positioned  Position:     Skin Local:     Spinal Needle:  Spinal needle:[place]        Spinal needle length: 3.5 inch Spinal needle attempts: 1.  Epidural Needle:              Epidural needle attempts: 1  Epidural Catheter:              Block Events:     Test dose:                Medications    bupivacaine PF 0.75%-dextrose 8.25% (MARCAINE SPINAL) spinal injection - Intrathecal   1.2 mL - 01/31/2022 8:15:00 AM  fentaNYL (SUBLIMAZE) 50 mcg/mL injection - Intrathecal   25 mcg - 01/31/2022 8:16:00 AM  Dosing   Catheter secured.       NOTES

## 2022-01-31 NOTE — H&P (Signed)
[facility]  OB-GYN   History and Physical Note    PCP:  No Pcp  Referring Physician:  No ref. provider found  Date of Admission:  01/31/2022      HPI:    31-year-old married white female gravida 4 para 3003 at 11 weeks admitted to [facility] in latent phase of labor with spontaneous rupture membranes at 0030 patient has been having regular contractions since yesterday every 3 minute she would no documented cervical change she was discharged home she was scheduled for repeat cesarean section with bilateral salpingectomy on Monday February 02, 2022  Prenatal care has been complicated by 1.  History of prior low-transverse cesarean section x3 2.  Desired sterilization 3.  Chronic active hepatitis-C past OB history October 29, 2011 cesarean section at 40 weeks birth weight 6 lb 2 oz October 19 2017 39 weeks cesarean sections 6 lb 4 oz September 28, 2019 39 week cesarean section  Medical History:  Negative  Past surgical history:  Cesarean section x3  Prenatal labs blood type is A positive negative antibody screen reactive hepatitis-C surface antigen to be hepatitis-B negative rubella immune chlamydia and GC negative 1 hour post Glucola 127 18th of November hemoglobin was 9.2  Patient's last menstrual period was 05/03/2021 (exact date).     Estimated Date of Delivery: 11/25/[redacted]       Weeks gestation:  [redacted]w[redacted]d    Constitutional: negative  Eyes: negative  Ears, nose, mouth, throat, and face: negative  Respiratory: negative  Cardiovascular: negative  Gastrointestinal: negative  Genitourinary:negative  Integument/breast: negative  Musculoskeletal:negative     Objective:   Height: 154.9 cm (5\' 1" )  Pre-Pregnancy Weight: 45.4 kg (100 lb)  Weight: 68.5 kg (151 lb)  BMI (Calculated): 28.59  Temperature: 37 C (98.6 F)  Heart Rate: 80  BP (Non-Invasive): (!) 145/74    Physical Exam:  Heart regular rate and rhythm lungs are clear to auscultation crackles or wheezes abdomen soft gravid fundal  height 36 cm cervix 2/80/-1 thin meconium extremities nontender nonstress test baseline 120s reactive with regular contractions every 3 minutes    Lab Results   Component Value Date    ABORHD A POSITIVE 01/31/2022    HGB 9.2 (L) 01/31/2022    HCT 28.3 (L) 01/31/2022     OB OUTSIDE LAB RESULTS:                              Rubella: No results found for: "RUBELLAIGG"  RPR: No results found for: "RPR"  HIV:   HIV SCREEN, COMBINED ANTIGEN & ANTIBODY   Date Value Ref Range Status   06/23/2021 Non-reactive  Final     Hep B s Ag: No components found for: "HAA"  Hep C Ab: .No components found for: "HCV" No results found for: "HCVQNT"  CF screen: No components found for: "ACYST1"  GC: No components found for: "NGO"  Chlamydia: No components found for: "CTR"  AFP: No results found for: "AFP"  1hr Glucose: No components found for: "GTT1"  GBS:   GROUP B STREPTOCOCCUS (GBS) DNA BY NAAT   Date Value Ref Range Status   01/07/2022 Negative Negative Final         Assessment and Plan     Active Hospital Problems    Diagnosis    Primary Problem: [redacted] weeks gestation of pregnancy     Will proceed with repeat low transverse cesarean section bilateral  salpingectomy nature indications and risk of surgery discussed with the patient all questions answered and patient is willing proceed the risks including but not limited bleeding infection trauma to internal organs bowel bladder blood vessels nerves were prophylax with cefazolin 2 g IV piggyback 1 g every 8 hours spinal anesthesia routine postop care she will need follow up GI regarding her chronic hepatitis-C see postpartum    Conley Rolls, DO  01/31/2022 07:53      This note was partially generated using MModal Fluency Direct system, and there may be some incorrect words, spellings, and punctuation that were not noted in checking the note before saving.

## 2022-01-31 NOTE — OR Surgeon (Signed)
[facility]  Delivery Note - Cesarean Section      Name: Patricia Santiago   MRN: [id number]  DOB:  Jan 13, 1991  Admitted: 01/31/2022  2:37 AM  Date of Service: 01/31/2022       Pre-delivery diagnosis:    1. Pregnancy uterine non delivered at 39 weeks 2.  Latent phase of labor for spontaneous rupture membranes with thin meconium 3.  Prior low-transverse cesarean section x3 4.  Desired sterilization    Post-delivery diagnosis:    Same as above delivered    Indication for Cesarean Delivery:  31 y.o. [id number] [redacted]w[redacted]d was admitted to [facility] for repeat low transverse cesarean section.  With bilateral salpingectomy.  Patient presents to [facility] in latent phase of labor with spontaneous rupture membranes at 12:30 a.m.    Procedure:  Repeat Low Transverse Cesarean Delivery bilateral salpingectomy    Prophylaxis:  Antibiotic:  Cefazolin 2 g              Thromboembolic: Venodynes    Findings:  Viable female neonate birth weight 3536 g 7 lb 13 oz Apgars 9 at 1 minute and 9 at 5 minutes delivered at 0838 on 18th of November thin meconium noted placenta was spontaneous and intact extremely thin lower uterine segment and bilateral salpingectomy informed usual fashion      Attending(s):  Remonia Richter D.O. FACOG    Assistant(s): None    Estimated Blood Loss:  800 ml    Intraoperative Fluids:  1000 ml Crystalloid    Intraoperative Obstetric Medications:  Methergine 0.2 mg IM oxytocin    Complications: None    Pathology Specimen(s):  Bilateral fallopian tubes    Drains: Foley Catheter    Disposition: Recovery in [facility]    Condition: Stable    Operative Detail:         Under adequate spinal anesthesia, the 16 French Foley catheter was sterilely placed in the bladder. Vagina prepped with Betadine. Patient was placed in the dorsal supine position abdomen was prepped and draped the usual sterile manner for surgery.  A  Pfannenstiel incision was made with a #10 blade. Subcutaneous tissue was opened using electrocoagulation. The fascial incision divided extended bilaterally with electrocoagulation. The peritoneal was extended superiorly and inferiorly using the Metzenbaum scissors the Alexis O ring was placed transverse. Bladder flap was developed using sharp dissection. Low transverse uterine incision is made using #10 blade and extended laterally and superiorly using the bandage scissors. Oropharynx and nasopharynx were suctioned with a bulb syringe. The umbilical  cord doubly clamped and divided.  Placenta was manually extracted. Uterine cavity explored. Hysterotomy incision was closed in running locking imbricating fashion #1 chromic.  Abdomen is irrigated using Irrisept. A bilateral salpingectomy was performed in the usual fashion using 0 plain gut suture 0 chromic real-tie.  Fallopian tube were sent to pathology.  Patient did require IV Versed and ketamine for supplemental anesthesia prior to closure  Following a correct needle and sponge count the peritoneum was closed continuous fashion with 2-0 Vicryl. Rectus abdominus muscles plicated using interrupted sutures of 0 Chromic suture. The fascia was closed using 2 sutures of #1 Vicryl starting at the apices and tied in the midline. Subcutaneous space was obliterated using U sutures of 2-0 Vicryl.  Skin was closed in continuous subcuticular fashion using 4-0 Biosyn.  A Dermabond Prineo dressing was placed.    Patient and neonate were stable following delivery  All instrument, sponge and needle counts were correct at the end of the procedure. The patient tolerated the procedure well. The patient was transferred to PACU.    Disposition:  Infant(s) admitted to newborn nursery.    Birdie Hopes. Cassell Voorhies DO FACOG

## 2022-01-31 NOTE — Care Plan (Signed)
Problem: Adult Inpatient Plan of Care  Goal: Absence of Hospital-Acquired Illness or Injury  Outcome: Ongoing (see interventions/notes)  Intervention: Prevent Skin Injury  Recent Flowsheet Documentation  Taken 01/31/2022 2015 by Elijah Birk, RN  Skin Protection: adhesive use limited  Goal: Optimal Comfort and Wellbeing  Outcome: Ongoing (see interventions/notes)  Intervention: Provide Person-Centered Care  Recent Flowsheet Documentation  Taken 01/31/2022 2015 by Elijah Birk, RN  Trust Relationship/Rapport:   care explained   choices provided   emotional support provided   questions answered   questions encouraged   reassurance provided   thoughts/feelings acknowledged  Goal: Rounds/Family Conference  Outcome: Ongoing (see interventions/notes)     Problem: Postpartum (Cesarean Delivery)  Goal: Successful Maternal Role Transition  Outcome: Ongoing (see interventions/notes)  Goal: Hemostasis  Outcome: Ongoing (see interventions/notes)  Goal: Effective Bowel Elimination  Outcome: Ongoing (see interventions/notes)  Goal: Fluid and Electrolyte Balance  Outcome: Ongoing (see interventions/notes)  Goal: Absence of Infection Signs and Symptoms  Outcome: Ongoing (see interventions/notes)  Goal: Anesthesia/Sedation Recovery  Outcome: Ongoing (see interventions/notes)  Goal: Optimal Pain Control and Function  Outcome: Ongoing (see interventions/notes)  Goal: Nausea and Vomiting Relief  Outcome: Ongoing (see interventions/notes)  Goal: Effective Urinary Elimination  Outcome: Ongoing (see interventions/notes)  Goal: Effective Oxygenation and Ventilation  Outcome: Ongoing (see interventions/notes)

## 2022-02-01 LAB — CBC WITH DIFF
BASOPHIL #: 0 10*3/uL (ref 0.00–0.10)
BASOPHIL %: 0 % (ref 0–1)
EOSINOPHIL #: 0.2 10*3/uL (ref 0.00–0.50)
EOSINOPHIL %: 2 %
HCT: 21.2 % — ABNORMAL LOW (ref 31.2–41.9)
HGB: 6.7 g/dL — CL (ref 10.9–14.3)
LYMPHOCYTE #: 1.8 10*3/uL (ref 1.00–3.00)
LYMPHOCYTE %: 16 % (ref 16–44)
MCH: 23 pg — ABNORMAL LOW (ref 24.7–32.8)
MCHC: 31.3 g/dL — ABNORMAL LOW (ref 32.3–35.6)
MCV: 73.4 fL — ABNORMAL LOW (ref 75.5–95.3)
MONOCYTE #: 0.7 10*3/uL (ref 0.30–1.00)
MONOCYTE %: 6 % (ref 5–13)
MPV: 8.1 fL (ref 7.9–10.8)
NEUTROPHIL #: 8.9 10*3/uL — ABNORMAL HIGH (ref 1.85–7.80)
NEUTROPHIL %: 76 % (ref 43–77)
PLATELETS: 254 10*3/uL (ref 140–440)
RBC: 2.89 10*6/uL — ABNORMAL LOW (ref 3.63–4.92)
RDW: 17.8 % — ABNORMAL HIGH (ref 12.3–17.7)
WBC: 11.7 10*3/uL (ref 3.8–11.8)

## 2022-02-01 NOTE — Care Plan (Signed)
Problem: Adult Inpatient Plan of Care  Goal: Absence of Hospital-Acquired Illness or Injury  Outcome: Ongoing (see interventions/notes)  Intervention: Prevent Skin Injury  Recent Flowsheet Documentation  Taken 02/01/2022 1925 by Ethlyn Daniels, RN  Skin Protection: adhesive use limited  Intervention: Prevent and Manage VTE (Venous Thromboembolism) Risk  Recent Flowsheet Documentation  Taken 02/01/2022 1925 by Ethlyn Daniels, RN  VTE Prevention/Management: compression stockings initiated  Goal: Optimal Comfort and Wellbeing  Outcome: Ongoing (see interventions/notes)  Goal: Rounds/Family Conference  Outcome: Ongoing (see interventions/notes)     Problem: Postpartum (Cesarean Delivery)  Goal: Successful Maternal Role Transition  Outcome: Ongoing (see interventions/notes)  Goal: Hemostasis  Outcome: Ongoing (see interventions/notes)  Goal: Effective Bowel Elimination  Outcome: Ongoing (see interventions/notes)  Goal: Fluid and Electrolyte Balance  Outcome: Ongoing (see interventions/notes)  Goal: Absence of Infection Signs and Symptoms  Outcome: Ongoing (see interventions/notes)  Goal: Anesthesia/Sedation Recovery  Outcome: Ongoing (see interventions/notes)  Goal: Optimal Pain Control and Function  Outcome: Ongoing (see interventions/notes)  Goal: Nausea and Vomiting Relief  Outcome: Ongoing (see interventions/notes)  Goal: Effective Urinary Elimination  Outcome: Ongoing (see interventions/notes)  Goal: Effective Oxygenation and Ventilation  Outcome: Ongoing (see interventions/notes)  Intervention: Optimize Oxygenation and Ventilation  Recent Flowsheet Documentation  Taken 02/01/2022 1925 by Ethlyn Daniels, RN  Airway/Ventilation Management: airway patency maintained

## 2022-02-02 ENCOUNTER — Encounter (INDEPENDENT_AMBULATORY_CARE_PROVIDER_SITE_OTHER): Payer: Self-pay | Admitting: OBSTETRICS/GYNECOLOGY

## 2022-02-02 ENCOUNTER — Ambulatory Visit (HOSPITAL_COMMUNITY): Admit: 2022-02-02 | Payer: Medicaid Other | Admitting: OBSTETRICS/GYNECOLOGY

## 2022-02-02 LAB — COMPREHENSIVE METABOLIC PANEL, NON-FASTING
ALBUMIN/GLOBULIN RATIO: 1 (ref 0.8–1.4)
ALBUMIN: 2.7 g/dL — ABNORMAL LOW (ref 3.5–5.7)
ALKALINE PHOSPHATASE: 136 U/L — ABNORMAL HIGH (ref 34–104)
ALT (SGPT): 12 U/L (ref 7–52)
ANION GAP: 6 mmol/L (ref 4–13)
AST (SGOT): 19 U/L (ref 13–39)
BILIRUBIN TOTAL: 0.4 mg/dL (ref 0.3–1.2)
BUN/CREA RATIO: 21 (ref 6–22)
BUN: 13 mg/dL (ref 7–25)
CALCIUM, CORRECTED: 8.8 mg/dL — ABNORMAL LOW (ref 8.9–10.8)
CALCIUM: 7.8 mg/dL — ABNORMAL LOW (ref 8.6–10.3)
CHLORIDE: 108 mmol/L — ABNORMAL HIGH (ref 98–107)
CO2 TOTAL: 25 mmol/L (ref 21–31)
CREATININE: 0.61 mg/dL (ref 0.60–1.30)
ESTIMATED GFR: 123 mL/min/{1.73_m2} (ref >59)
GLOBULIN: 2.6 — ABNORMAL LOW (ref 2.9–5.4)
GLUCOSE: 90 mg/dL (ref 74–109)
OSMOLALITY, CALCULATED: 277 mOsm/kg (ref 270–290)
POTASSIUM: 3.7 mmol/L (ref 3.5–5.1)
PROTEIN TOTAL: 5.3 g/dL — ABNORMAL LOW (ref 6.4–8.9)
SODIUM: 139 mmol/L (ref 136–145)

## 2022-02-02 LAB — CBC WITH DIFF
BASOPHIL #: 0 10*3/uL (ref 0.00–0.10)
BASOPHIL %: 0 % (ref 0–1)
EOSINOPHIL #: 0.3 10*3/uL (ref 0.00–0.50)
EOSINOPHIL %: 3 %
HCT: 19.3 % — CL (ref 31.2–41.9)
HGB: 6.3 g/dL — CL (ref 10.9–14.3)
LYMPHOCYTE #: 1.7 10*3/uL (ref 1.00–3.00)
LYMPHOCYTE %: 19 % (ref 16–44)
MCH: 23.8 pg — ABNORMAL LOW (ref 24.7–32.8)
MCHC: 32.5 g/dL (ref 32.3–35.6)
MCV: 73 fL — ABNORMAL LOW (ref 75.5–95.3)
MONOCYTE #: 0.8 10*3/uL (ref 0.30–1.00)
MONOCYTE %: 9 % (ref 5–13)
MPV: 7.5 fL — ABNORMAL LOW (ref 7.9–10.8)
NEUTROPHIL #: 6.3 10*3/uL (ref 1.85–7.80)
NEUTROPHIL %: 69 % (ref 43–77)
PLATELETS: 244 10*3/uL (ref 140–440)
RBC: 2.65 10*6/uL — ABNORMAL LOW (ref 3.63–4.92)
RDW: 17.8 % — ABNORMAL HIGH (ref 12.3–17.7)
WBC: 9.1 10*3/uL (ref 3.8–11.8)

## 2022-02-02 MED ORDER — IBUPROFEN 800 MG TABLET
800.0000 mg | ORAL_TABLET | Freq: Three times a day (TID) | ORAL | 2 refills | Status: AC | PRN
Start: 2022-02-02 — End: ?

## 2022-02-02 MED ORDER — POLYSACCHARIDE IRON COMPLEX 150 MG IRON CAPSULE
150.0000 mg | ORAL_CAPSULE | Freq: Every day | ORAL | 2 refills | Status: AC
Start: 2022-02-02 — End: ?

## 2022-02-02 MED ORDER — HYDROCODONE 7.5 MG-ACETAMINOPHEN 325 MG TABLET
1.0000 | ORAL_TABLET | ORAL | 0 refills | Status: AC | PRN
Start: 2022-02-02 — End: ?

## 2022-02-02 NOTE — Discharge Summary (Signed)
[facility]  DISCHARGE SUMMARY - OBSTETRICS      PATIENT NAME:  Patricia Santiago, Patricia Santiago  MRN:  [id number]  DOB:  11/06/1990    ENCOUNTER START DATE:  01/31/2022  INPATIENT ADMISSION DATE: 01/31/2022  DISCHARGE DATE:  02/02/2022    ATTENDING PHYSICIAN: Remonia Richter, DO  PRIMARY CARE PHYSICIAN: No Pcp     ADMISSION DIAGNOSIS: Onset of labor    WEEKS GESTATION ON ADMISSION:: [redacted]w[redacted]d    DISCHARGE DIAGNOSIS: Term pregnancy - delivered    DISCHARGE MEDICATIONS:     Current Discharge Medication List        CONTINUE these medications - NO CHANGES were made during your visit.        Details   ondansetron 8 mg Tablet, Rapid Dissolve  Commonly known as: ZOFRAN ODT   Refills: 0     Prenatal 28 mg iron- 800 mcg Tablet  Generic drug: PNV cmb#95-ferrous fumarate-FA   1 Tablet, Oral, DAILY  Refills: 0     progesterone micronized 200 mg Capsule  Commonly known as: PROMETRIUM   200 mg, DAILY  Refills: 0     promethazine 25 mg Tablet  Commonly known as: PHENERGAN   TAKE 1 TABLET BY MOUTH EVERY 4 TO 6 HOURS  Refills: 0              DISCHARGE INSTRUCTIONS:   No discharge procedures on file.      SIGNIFICANT LAB:     Lab Results   Component Value Date    WBC 9.1 02/02/2022    HGB 6.3 (LL) 02/02/2022    HCT 19.3 (LL) 02/02/2022    PLTCNT 244 02/02/2022         Procedure:  repeat Cesarean section for  previous uterine surgery using TLCS (Transverse Low Uterine Segment) uterine incision.     Anesthesia:  spinal    Postpartum Complications: None    Edinburgh PP Depression score upon admission:       Patricia Santiago, Patricia Santiago [[id number]]      Delivery Information    Birth date/time: 01/31/2022 3790  Sex: Female  Delivery type: C-Section, Low Transverse  Complications: None       Newborn Measurements    Weight: 3544 g  Length: 48.3 cm  Head circumference: 34.3 cm  Chest circumference: 33 cm       Newborn  Apgars    Living status: Living      Skin color:    Heart rate:    Reflex Irrit:    Muscle tone:    Resp. effort:    Total:     1 Min:    1    2     2    2    2    9     5  Min:    1    2    2    2    2    9     10  Min:     15 Min:     20 Min:       Apgars assigned by:              Feeding Method:  bottle    COURSE IN HOSPITAL:  31 year old married white female gravida 4 para 3003 at 37 weeks admitted to [facility] with latent phase of labor with spontaneous rupture membranes 0030 thin meconium she underwent a repeat low transverse cesarean section bilateral salpingectomy delivering a viable female neonate  birth weight 3536 g 7 lb 13 oz Apgars 9 at 1 minute and 9 at 5 minutes delivered at 8:38 a.m. on 18th November 2023 bilateral salpingectomy was performed estimated blood loss 800 cc she was transferred to [facility] where she is had uncomplicated postop course except for severe anemia which is stable hemoglobin at discharge was 6.3 she is discharged to home she will follow up in the office in 2 weeks    CONDITION ON DISCHARGE: Alert    DISCHARGE DISPOSITION:  Home discharge       Conley Rolls, DO  02/02/2022 09:53

## 2022-02-02 NOTE — Anesthesia Postprocedure Evaluation (Signed)
Anesthesia Post Op Evaluation    Patient: Patricia Santiago  Procedure(s):  REPEAT CESAREAN SECTION    Last Vitals:Temperature: 36.7 C (98 F) (02/02/22 0732)  Heart Rate: 68 (02/02/22 0732)  BP (Non-Invasive): 139/79 (02/02/22 0732)  Respiratory Rate: 20 (02/02/22 0732)  SpO2: 98 % (01/31/22 1016)    No notable events documented.    Patient is sufficiently recovered from the effects of anesthesia to participate in the evaluation and has returned to their pre-procedure level.  Patient location during evaluation: bedside       Patient participation: complete - patient participated  Level of consciousness: awake and alert    Pain management: satisfactory to patient  Airway patency: patent    Anesthetic complications: no  Cardiovascular status: hemodynamically stable and acceptable  Respiratory status: acceptable and room air  Hydration status: acceptable  Patient post-procedure temperature: Pt Normothermic   PONV Status: Absent  Comments: Patient denies headache, LE paresthesia or weakness, urinary retention, and significant back pain.

## 2022-02-03 DIAGNOSIS — Z302 Encounter for sterilization: Secondary | ICD-10-CM

## 2022-02-03 LAB — SURGICAL PATHOLOGY SPECIMEN

## 2022-02-17 ENCOUNTER — Ambulatory Visit (INDEPENDENT_AMBULATORY_CARE_PROVIDER_SITE_OTHER): Payer: Medicaid Other | Admitting: OBSTETRICS/GYNECOLOGY

## 2022-02-19 NOTE — Procedures (Signed)
OB/GYN, [place]  [address] [place] [address]    Procedure Note    Name: Patricia Santiago MRN:  [id number]   Date: 01/26/2022 Age: 31 y.o.  DOB:   1990/04/12       [place] PREGNANT UTERUS, FETAL & MATERNAL, AFTER 1ST TRIMESTER, TRANSABDOMINAL; UP TO 5 GESTATIONS (AMB ONLY)    Performed by: Remonia Richter, DO  Authorized by: Remonia Richter, DO    Time Out:      Immediately before the procedure, a time out was called:  Yes      Patient verified:  Yes      Procedure verified:  Yes      Site verified:  Yes  Procedure Details:      Number of gestations:  1       Documentation:  Ultrasound  report with measurements reviewed. & signed. Results scanned to imaging order.   Interpretation:  Reviewed and discussed with the patient          Remonia Richter, DO

## 2022-02-19 NOTE — Procedures (Signed)
OB[facility]N, [facility]  [address]  [place] [address]    Procedure Note    Name: Patricia Santiago MRN:  [id number]   Date: 01/26/2022 Age: 30 y.o.  DOB:   [birth year]       45038 - FETAL NON STRESS TEST (AMB ONLY)    Performed by: Remonia Richter, DO  Authorized by: Remonia Richter, DO    Time Out:     Immediately before the procedure, a time out was called:  Yes    Patient verified:  Yes    Procedure Verified:  Yes    Site Verified:  Yes  Documentation:      Nonstress test revealed a baseline fetal heart 120s good beat-to-beat variability accelerations category 1 fetal heart rate tracing occasional contractions      Remonia Richter, DO

## 2022-03-03 ENCOUNTER — Ambulatory Visit (INDEPENDENT_AMBULATORY_CARE_PROVIDER_SITE_OTHER): Payer: Medicaid Other | Admitting: OBSTETRICS/GYNECOLOGY

## 2022-03-05 NOTE — Procedures (Signed)
OB[facility]  [address]  [place] [address]    Procedure Note    Name: Patricia Santiago MRN:  [id number]   Date: 01/29/2022 Age: 31 y.o.  DOB:   14-Apr-1990       28786 - FETAL NON STRESS TEST (AMB ONLY)    Performed by: Remonia Richter, DO  Authorized by: Remonia Richter, DO    Time Out:     Immediately before the procedure, a time out was called:  Yes    Patient verified:  Yes    Procedure Verified:  Yes    Site Verified:  Yes  Documentation:      Nonstress test revealed a baseline fetal heart rate 120s good beat-to-beat variability accelerations category 1 fetal heart tracing uterine irritability no pathologic decelerations      Remonia Richter, DO

## 2022-03-05 NOTE — Progress Notes (Signed)
[facility]  [address]  [place] [address]       RETURN OBSTETRICAL VISIT     Name: Patricia Santiago MRN:  [id number]   Date: 01/29/2022 Age: 31 y.o.     Patricia Santiago is a 31 y.o. [id number].   Nursing Notes:   Clovis Fredrickson, LPN  47/82/95 6213  Signed  Follow up OB    C/o ctx pain, cramping, pressure for the past 2-3 weeks. Will wake her up from her sleep. Some edema in her feet.    C-section for 11/20    She wants to have a tubal after her delivery.    +FMC    Clovis Fredrickson, LPN     Patient reports follow up OB      PE:  Weight: 68.3 kg (150 lb 8 oz)  BP (Non-Invasive): (!) 149/84  # of Fetuses: 1  Protein: Negative  Glucose: Negative  Ketones: Negative  Fetal Movement: Present  Edema: 2+  FHR (1): noted on NST  OB Exam Comments: CTX, cramping pressure    Fundal Height:  38 cm  Vaginal/Cervical exam:  Closed long    Patient Active Problem List   Diagnosis    High-risk pregnancy in second trimester    History of cesarean delivery    Chronic active hepatitis C (CMS HCC)    Threatened abortion in second trimester    Large for gestational age fetus affecting management of mother    False labor before 34 completed weeks of gestation in third trimester    Small for gestational age (SGA)    Gestational hypertension    [redacted] weeks gestation of pregnancy       Data Reviewed:   Gestational age appropriate labs reviewed and [place] reviewed see procedure note  RMHOBLABS: OB Greater Than 36 Week Labs    Lab Results   Component Value Date    HGB 6.3 (LL) 02/02/2022    HCT 19.3 (LL) 02/02/2022    WBC 9.1 02/02/2022    PLTCNT 244 02/02/2022    ABORHD A POSITIVE 01/31/2022    ABSCR NEGATIVE 01/31/2022        Assessment     ICD-10-CM    1. Supervision of high risk pregnancy in third trimester  O09.93 59025 - FETAL NON STRESS TEST (AMB ONLY)     POCT URINE DIPSTICK          Nonstress test performed as described discussed tubal consultation  Plan:  No follow-ups on file.     Remonia Richter, DO  03/05/2022  19:39       This note was partially generated using MModal Fluency Direct system, and there may be some incorrect words, spellings, and punctuation that were not noted in checking the note before saving.

## 2022-03-18 NOTE — Progress Notes (Signed)
OB/GYN, [place]  [address]  [place] [address]       RETURN OBSTETRICAL VISIT     Name: Patricia Santiago MRN:  [id number]   Date: 01/26/2022 Age: 32 y.o.     Patricia Santiago is a 32 y.o. [id number].   Nursing Notes:   Ernie Avena, LPN  84/13/24 4010  Signed  C/o cramping, tightening, pressure.  Similar to CTX like pain, says it sometimes wakes her out of her sleep      +FMC    Summer Price, LPN     Patient reports FU      PE:  Weight: 67.7 kg (149 lb 4 oz)  BP (Non-Invasive): 134/86  # of Fetuses: 1  Protein: Negative  Glucose: Negative  Ketones: Negative  Fundal Height: 32  Fetal Movement: Present  Presentation: Cephalic  Edema: 2+  FHR (1): 117  OB Exam Comments: cramping    Fundal Height:32 cm  Vaginal/Cervical exam: closed/long    Patient Active Problem List   Diagnosis    High-risk pregnancy in second trimester    History of cesarean delivery    Chronic active hepatitis C (CMS HCC)    Threatened abortion in second trimester    Large for gestational age fetus affecting management of mother    False labor before 69 completed weeks of gestation in third trimester    Small for gestational age (SGA)    Gestational hypertension    [redacted] weeks gestation of pregnancy       Data Reviewed:   Gestational age appropriate labs reviewed and [place] reviewed see procedure note  RMHOBLABS: OB 24-28 Week Labs     Lab Results   Component Value Date    ABORHD A POSITIVE 01/31/2022    ABSCR NEGATIVE 01/31/2022    HGB 6.3 (LL) 02/02/2022    HCT 19.3 (LL) 02/02/2022    WBC 9.1 02/02/2022    PLTCNT 244 02/02/2022    GLT1 127 11/19/2021        Assessment     ICD-10-CM    1. Supervision of high risk pregnancy in third trimester  O09.93 POCT URINE DIPSTICK     59025 - FETAL NON STRESS TEST (AMB ONLY)     US FETAL TRANSABD 2ND/3RD TRI>14 WKS 27253 W EA ADD [phone]     [place] PREGNANT UTERUS, FETAL & MATERNAL, AFTER 1ST TRIMESTER, TRANSABDOMINAL; UP TO 5 GESTATIONS (AMB ONLY)            Plan:NST and Limited Ultrasound  precautions given FU 3 days  Return in 3 days (on 01/29/2022) for OB NST.     Conley Rolls, DO  03/18/2022 13:24       This note was partially generated using MModal Fluency Direct system, and there may be some incorrect words, spellings, and punctuation that were not noted in checking the note before saving.
